# Patient Record
Sex: Female | Born: 1965
Health system: Southern US, Community
[De-identification: ages and names within clinical notes are randomized; demographics above are authoritative.]

## PROBLEM LIST (undated history)

## (undated) DIAGNOSIS — R9431 Abnormal electrocardiogram [ECG] [EKG]: Secondary | ICD-10-CM

## (undated) DIAGNOSIS — K760 Fatty (change of) liver, not elsewhere classified: Secondary | ICD-10-CM

## (undated) DIAGNOSIS — E7800 Pure hypercholesterolemia, unspecified: Secondary | ICD-10-CM

## (undated) DIAGNOSIS — D72819 Decreased white blood cell count, unspecified: Secondary | ICD-10-CM

## (undated) DIAGNOSIS — G4730 Sleep apnea, unspecified: Secondary | ICD-10-CM

## (undated) DIAGNOSIS — E785 Hyperlipidemia, unspecified: Secondary | ICD-10-CM

## (undated) DIAGNOSIS — I10 Essential (primary) hypertension: Secondary | ICD-10-CM

## (undated) DIAGNOSIS — R7301 Impaired fasting glucose: Secondary | ICD-10-CM

## (undated) DIAGNOSIS — N3281 Overactive bladder: Secondary | ICD-10-CM

## (undated) DIAGNOSIS — M545 Low back pain, unspecified: Secondary | ICD-10-CM

## (undated) DIAGNOSIS — G4733 Obstructive sleep apnea (adult) (pediatric): Secondary | ICD-10-CM

## (undated) DIAGNOSIS — D259 Leiomyoma of uterus, unspecified: Secondary | ICD-10-CM

## (undated) DIAGNOSIS — E559 Vitamin D deficiency, unspecified: Secondary | ICD-10-CM

## (undated) DIAGNOSIS — D649 Anemia, unspecified: Secondary | ICD-10-CM

## (undated) HISTORY — DX: Pure hypercholesterolemia, unspecified: E78.00

## (undated) HISTORY — DX: Sleep apnea, unspecified: G47.30

## (undated) HISTORY — DX: Fatty (change of) liver, not elsewhere classified: K76.0

## (undated) HISTORY — DX: Low back pain, unspecified: M54.50

## (undated) HISTORY — DX: Obstructive sleep apnea (adult) (pediatric): G47.33

## (undated) HISTORY — DX: Morbid (severe) obesity due to excess calories: E66.01

## (undated) HISTORY — DX: Decreased white blood cell count, unspecified: D72.819

## (undated) HISTORY — DX: Vitamin D deficiency, unspecified: E55.9

## (undated) HISTORY — DX: Hyperlipidemia, unspecified: E78.5

## (undated) HISTORY — DX: Leiomyoma of uterus, unspecified: D25.9

## (undated) HISTORY — DX: Abnormal electrocardiogram (ECG) (EKG): R94.31

## (undated) HISTORY — DX: Impaired fasting glucose: R73.01

## (undated) HISTORY — DX: Essential (primary) hypertension: I10

## (undated) HISTORY — DX: Anemia, unspecified: D64.9

## (undated) HISTORY — DX: Overactive bladder: N32.81

---

## 1999-10-28 ENCOUNTER — Other Ambulatory Visit: Admission: RE | Admit: 1999-10-28 | Discharge: 1999-10-28 | Payer: Self-pay | Admitting: Obstetrics and Gynecology

## 1999-12-23 ENCOUNTER — Encounter: Payer: Self-pay | Admitting: Obstetrics & Gynecology

## 1999-12-23 ENCOUNTER — Ambulatory Visit (HOSPITAL_COMMUNITY): Admission: RE | Admit: 1999-12-23 | Discharge: 1999-12-23 | Payer: Self-pay | Admitting: Obstetrics & Gynecology

## 2000-03-09 ENCOUNTER — Ambulatory Visit (HOSPITAL_COMMUNITY): Admission: RE | Admit: 2000-03-09 | Discharge: 2000-03-09 | Payer: Self-pay | Admitting: Obstetrics and Gynecology

## 2000-03-09 ENCOUNTER — Encounter: Payer: Self-pay | Admitting: Obstetrics and Gynecology

## 2000-04-30 ENCOUNTER — Inpatient Hospital Stay (HOSPITAL_COMMUNITY): Admission: AD | Admit: 2000-04-30 | Discharge: 2000-05-03 | Payer: Self-pay | Admitting: Obstetrics and Gynecology

## 2000-05-04 ENCOUNTER — Encounter: Admission: RE | Admit: 2000-05-04 | Discharge: 2000-06-03 | Payer: Self-pay | Admitting: Obstetrics and Gynecology

## 2000-07-04 ENCOUNTER — Encounter: Admission: RE | Admit: 2000-07-04 | Discharge: 2000-08-03 | Payer: Self-pay | Admitting: Obstetrics and Gynecology

## 2000-08-04 ENCOUNTER — Encounter: Admission: RE | Admit: 2000-08-04 | Discharge: 2000-09-03 | Payer: Self-pay | Admitting: Obstetrics and Gynecology

## 2000-10-04 ENCOUNTER — Encounter: Admission: RE | Admit: 2000-10-04 | Discharge: 2000-11-03 | Payer: Self-pay | Admitting: Obstetrics and Gynecology

## 2000-11-04 ENCOUNTER — Encounter: Admission: RE | Admit: 2000-11-04 | Discharge: 2000-12-04 | Payer: Self-pay | Admitting: Obstetrics and Gynecology

## 2002-03-13 ENCOUNTER — Other Ambulatory Visit: Admission: RE | Admit: 2002-03-13 | Discharge: 2002-03-13 | Payer: Self-pay | Admitting: *Deleted

## 2002-03-13 ENCOUNTER — Other Ambulatory Visit: Admission: RE | Admit: 2002-03-13 | Discharge: 2002-03-13 | Payer: Self-pay | Admitting: Obstetrics and Gynecology

## 2002-05-15 ENCOUNTER — Ambulatory Visit (HOSPITAL_COMMUNITY): Admission: RE | Admit: 2002-05-15 | Discharge: 2002-05-15 | Payer: Self-pay | Admitting: Obstetrics and Gynecology

## 2002-05-15 ENCOUNTER — Encounter: Payer: Self-pay | Admitting: Obstetrics and Gynecology

## 2002-05-20 ENCOUNTER — Ambulatory Visit (HOSPITAL_COMMUNITY): Admission: RE | Admit: 2002-05-20 | Discharge: 2002-05-20 | Payer: Self-pay | Admitting: Obstetrics and Gynecology

## 2002-05-20 ENCOUNTER — Encounter: Payer: Self-pay | Admitting: Obstetrics and Gynecology

## 2002-09-20 ENCOUNTER — Inpatient Hospital Stay (HOSPITAL_COMMUNITY): Admission: AD | Admit: 2002-09-20 | Discharge: 2002-09-20 | Payer: Self-pay | Admitting: Obstetrics and Gynecology

## 2002-09-29 ENCOUNTER — Inpatient Hospital Stay (HOSPITAL_COMMUNITY): Admission: AD | Admit: 2002-09-29 | Discharge: 2002-10-02 | Payer: Self-pay | Admitting: Obstetrics and Gynecology

## 2002-10-03 ENCOUNTER — Encounter: Admission: RE | Admit: 2002-10-03 | Discharge: 2002-11-02 | Payer: Self-pay | Admitting: Obstetrics and Gynecology

## 2002-10-06 ENCOUNTER — Inpatient Hospital Stay (HOSPITAL_COMMUNITY): Admission: AD | Admit: 2002-10-06 | Discharge: 2002-10-06 | Payer: Self-pay | Admitting: Obstetrics and Gynecology

## 2002-11-03 ENCOUNTER — Encounter: Admission: RE | Admit: 2002-11-03 | Discharge: 2002-12-03 | Payer: Self-pay | Admitting: Obstetrics and Gynecology

## 2003-01-03 ENCOUNTER — Encounter: Admission: RE | Admit: 2003-01-03 | Discharge: 2003-02-02 | Payer: Self-pay | Admitting: Obstetrics and Gynecology

## 2003-03-05 ENCOUNTER — Encounter: Admission: RE | Admit: 2003-03-05 | Discharge: 2003-04-04 | Payer: Self-pay | Admitting: Obstetrics and Gynecology

## 2003-04-05 ENCOUNTER — Encounter: Admission: RE | Admit: 2003-04-05 | Discharge: 2003-05-05 | Payer: Self-pay | Admitting: Obstetrics and Gynecology

## 2003-06-03 ENCOUNTER — Encounter: Admission: RE | Admit: 2003-06-03 | Discharge: 2003-07-03 | Payer: Self-pay | Admitting: Obstetrics and Gynecology

## 2003-08-03 ENCOUNTER — Encounter: Admission: RE | Admit: 2003-08-03 | Discharge: 2003-09-02 | Payer: Self-pay | Admitting: Obstetrics and Gynecology

## 2005-01-27 ENCOUNTER — Other Ambulatory Visit: Admission: RE | Admit: 2005-01-27 | Discharge: 2005-01-27 | Payer: Self-pay | Admitting: Family Medicine

## 2006-01-31 ENCOUNTER — Other Ambulatory Visit: Admission: RE | Admit: 2006-01-31 | Discharge: 2006-01-31 | Payer: Self-pay | Admitting: *Deleted

## 2006-02-16 ENCOUNTER — Ambulatory Visit: Payer: Self-pay | Admitting: Gastroenterology

## 2007-11-25 ENCOUNTER — Other Ambulatory Visit: Admission: RE | Admit: 2007-11-25 | Discharge: 2007-11-25 | Payer: Self-pay | Admitting: Family Medicine

## 2009-02-10 ENCOUNTER — Other Ambulatory Visit: Admission: RE | Admit: 2009-02-10 | Discharge: 2009-02-10 | Payer: Self-pay | Admitting: Family Medicine

## 2010-07-15 NOTE — Discharge Summary (Signed)
Ann Zamora, Ann Zamora                   ACCOUNT NO.:  [id number]   MEDICAL RECORD NO.:  [id number]                   PATIENT TYPE:  INP   LOCATION:  9114                                 F[facility]ILITY[facility]  [facility]   PHYSICIAN:  Janine Limbo, M.D.            DATE OF BIRTH:  [birth year]   DATE OF ADMISSION:  09/29/2002  DATE OF DISCHARGE:  10/02/2002                                 DISCHARGE SUMMARY   ADMITTING DIAGNOSES:  1. Intrauterine pregnancy at 39 weeks.  2. Previous cesarean section x3, with plan repeat.  3. Chronic hypertension with the patient on Aldomet in good control.  4. Obesity.   DISCHARGE DIAGNOSES:  1. Intrauterine pregnancy at 39 weeks.  2. Previous cesarean section x2, with plan repeat.  3. Chronic hypertension with the patient on Aldomet in good control.  4. Obesity.   PROCEDURE:  1. Repeat low transverse cesarean section.  2. Spinal anesthesia.   HOSPITAL COURSE:  Ann Zamora is a 45 year old gravida 4, para 3-0-0-3 at 40  weeks who was admitted on September 29, 2002 for repeat scheduled cesarean  section.  Her pregnancy had been remarkable for previous cesarean sections  x3 with desire for repeat, advanced maternal age with normal amniocentesis,  PIH with her first pregnancy and then subsequent diagnosis of chronic  hypertension, obesity, history of positive group B Strep with her previous  pregnancy.  The patient was taken to the operating room where a repeat low  transverse cesarean section was performed by Hal Morales, M.D. as  attending physician.  Spinal anesthesia was utilized.  Findings were a  viable female by the name of Braden weight 7 pounds 15 ounces, Apgars 9 and 9.  Estimated blood loss was 700 mL.  The patient was taken to the recovery room  in good condition.  Infant was taken to the full-term nursery in good  condition.  By postoperative day one patient was deemed well.  She was up at  lib.  She was breast-feeding.  Her partner was  planning a vasectomy the  following week.  Her incision was clean, dry, and intact.  Her hemoglobin  was 9.8 down from 12.1.  White blood cell count was 8.8 and platelets were  201,000.  The rest of her hospital stay was uncomplicated.  By postoperative  day three she was doing well.  She was up at lib.  She had been on Aldomet  250 mg every day with blood pressures very stable.  Highest blood pressure  noted was 148/96.  Most were in the 120s/70 range.  Her incision was clean,  dry, and intact with subcuticular sutures in place.  Her fundus was firm.  Lochia was scant.  Her extremities were within normal limits.  She was  deemed to have received the full benefit of her hospital stay and was  discharged home.   DISCHARGE INSTRUCTIONS:  Per [facility] handout.   DISCHARGE MEDICATIONS:  1. Motrin  600 mg p.o. q.6h. p.r.n. pain.  2. Tylox one to two p.o. q.3-4h. p.r.n. pain.  3. Aldomet 250 mg one p.o. daily.   DISCHARGE FOLLOWUP:  Two weeks at [facility] for a blood pressure  recheck and Aldomet reevaluation.  The patient will also follow up with her  primary physician for further blood pressure evaluation after her  postpartum.  The patient will also make a six week postpartum visit at  [facility].     Renaldo Reel Emilee Hero, C.N.M.                   Janine Limbo, M.D.    Leeanne Mannan  D:  10/02/2002  T:  10/02/2002  Job:  [id number]

## 2010-07-15 NOTE — Op Note (Signed)
Ann Zamora, Ann Zamora                   ACCOUNT NO.:  [id number]   MEDICAL RECORD NO.:  [id number]                   PATIENT TYPE:  INP   LOCATION:  9114                                 [facility]   PHYSICIAN:  Hal Morales, M.D.             DATE OF BIRTH:  Feb 19, 1966   DATE OF PROCEDURE:  09/29/2002  DATE OF DISCHARGE:                                 OPERATIVE REPORT   PREOPERATIVE DIAGNOSES:  1. Intrauterine pregnancy at term.  2. Prior cesarean section x3.  3. Desire for repeat cesarean section.  4. Chronic hypertension.   POSTOPERATIVE DIAGNOSES:  1. Intrauterine pregnancy at term.  2. Prior cesarean section x3.  3. Desire for repeat cesarean section.  4. Chronic hypertension.   OPERATION:  Repeat low transverse cesarean section.   SURGEON:  Hal Morales, M.D.   FIRST ASSISTANT:  Renaldo Reel. Emilee Hero, C.N.M.   ANESTHESIA:  Spinal.   ESTIMATED BLOOD LOSS:  750 mL.   COMPLICATIONS:  None.   FINDINGS:  The patient was delivered of a female infant whose name is Braden,  weighing 7 pounds 15 ounces, with Apgars of 9 and 9 at one and five minutes,  respectively.  The uterus was upper limits of normal size for the immediate  postpartum period.  The placenta contained an eccentrically-inserted three-  vessel cord.   DESCRIPTION OF PROCEDURE:  The patient was taken to the operating room after  appropriate identification and placed on the operating table.  After  placement of a spinal anesthetic, she was placed in the supine position with  a left lateral tilt.  The abdomen and perineum were prepped with multiple  layers of Betadine and a Foley catheter inserted into the bladder and  connected to straight drainage.  The abdomen was draped as a sterile field.  The site of the previous cesarean section incision was infiltrated with 10  mL of 0.25% Marcaine.  A transverse incision was made in the abdomen and the  abdomen opened in layers.  The peritoneum was  entered and the bladder blade  placed.  The uterus was incised approximately 2 cm above the uterovesical  fold and the infant delivered from the occiput transverse position with the  aid of a Kiwi vacuum extractor.  The nares and pharynx were suctioned and  the cord clamped and cut.  The infant was handed off to the awaiting  pediatricians.  The appropriate cord blood was drawn and the placenta  allowed to separate from the uterus and then was removed from the operative  field.  The uterine incision was closed with a running interlocking suture  of 0 Vicryl.  An imbricating suture of 0 Vicryl was placed.  Hemostasis was  achieved with several figure-of-eight sutures of 0 Vicryl in the left  uterine angle.  Copious irrigation was carried out and hemostasis noted to  be adequate.  The abdominal peritoneum was closed with a running suture of 2-  0 Vicryl.  The rectus muscle was reapproximated in the midline with a figure-  of-eight suture of 2-0 Vicryl.  The rectus fascia was closed with a running  suture of 0 Vicryl and reinforced on either side of midline with figure-of-  eight sutures of 0 Vicryl.  The subcutaneous tissue was irrigated and made  hemostatic with Bovie cautery.  A subcuticular suture of 3-0 Monocryl was  used to close the skin incision.  A sterile dressing was applied.  The  patient was taken from the operating room to the recovery room in  satisfactory condition, having tolerated the procedure well with sponge and  instrument counts correct.  The infant went to the full-term nursery.                                               Hal Morales, M.D.    VPH/MEDQ  D:  09/29/2002  T:  09/29/2002  Job:  [id number]

## 2010-07-15 NOTE — Op Note (Signed)
[facility]  Patient:    Ann Zamora, Ann Zamora                MRN: [id number] Proc. Date: 04/30/00 Attending:  Erie Noe P. Pennie Rushing, M.D.                           Operative Report  PREOPERATIVE DIAGNOSES:         1. Intrauterine pregnancy at term.                                 2. Prior cesarean section, desire for repeat                                    cesarean section.  POSTOPERATIVE DIAGNOSES:        1. Intrauterine pregnancy at term.                                 2. Prior cesarean section, desire for repeat                                    cesarean section  OPERATION:                      Repeat low transverse cesarean section.  SURGEON:                        Vanessa P. Pennie Rushing, M.D.  FIRST ASSISTANT:                Nigel Bridgeman, C.N.M.  ANESTHESIA:                     Spinal.  ESTIMATED BLOOD LOSS:           1000 cc.  COMPLICATIONS:                  Required use of two different vacuum extractors to deliver the fetal head.  FINDINGS:                       The uterus, tubes and ovaries were normal for the gravid state.  The patient was delivered of a female infant whos name is Apolinar Junes weighing 7 pounds 10 ounces with Apgars of 6 and 9 at one and five minutes, respectively.  FINDINGS:                       The patient was delivered of a infant whose name is     weighing  pounds  ounces with Apgars of  and   at one and five minutes, respectively.  The uterus, tubes and ovaries were normal for the gravid state.  DESCRIPTION OF PROCEDURE:       The patient was taken to the operating room after appropriate identification and placed on the operating room table. After placement of a spinal anesthetic, she was placed in the supine position with a left lateral tilt.  The abdomen and perineum were prepped with multiple layers of Betadine and a Foley catheter inserted into the bladder and connected to straight drainage.  The abdomen was  draped as a sterile field. After the assurance of adequate anesthesia, a transverse incision was made in the abdomen, and the abdomen opened in layers.  The peritoneum, and the uterus noted to have minimal development of the lower uterine segment.  Evaluation determined that the fetus was vertex but not in the pelvis.  A decision was made to make a low transverse incision approximately 1 cm above the uterovesical fold.  That was carried down to the level of the amniotic sac and bandage scissors were used to enlarge that incision prior to rupture of the amniotic membranes.  The amniotic membranes were then ruptured and a hand presentation noted.  The hand was reduced back into the uterus and a Kiwi vacuum extractor used to bring the vertex into the pelvis along with expulsive efforts from the surgical assistant.  The Kiwi vacuum extractor popped off approximately four times requiring replacement in an effort to adequately position the vertex for delivery.  A MityVac vacuum extractor was then used to further position the vertex, and the Kiwi vacuum extractor again applied which allowed delivery of the fetal head.  The nares and pharynx were suctioned and the remainder of the infant delivered without difficulty.  The cord was clamped and cut and the infant handed off to the awaiting pediatricians.  The appropriate cord blood was drawn and the placenta manually removed from the uterus.  The uterine cavity was then closed with a running interlocking suture of 0 Vicryl.  An imbricating suture of 0 Vicryl was then placed.  Hemostasis was achieved with a single figure-of-eight suture of 0 Vicryl.  Copious irrigation was carried out and the abdominal peritoneum closed with a running suture of 2-0 Vicryl.  The rectus muscles were reapproximated in the midline with a figure-of-eight suture of 2-0 Vicryl.  The rectus muscles were irrigated and noted to be hemostatic, and the rectus fascia closed with  a running suture of 0 Vicryl and then reinforced on either side of midline with figure-of-eight sutures of 0 Vicryl.  The subcutaneous tissue was made hemostatic with Bovie cautery and irrigated and skin staples applied to the skin incision.  A sterile dressing was applied, and the patient was taken from the operating room to the recovery room in satisfactory condition having tolerated the procedure well with sponge and instrument counts correct. DD:  04/30/00 TD:  04/30/00 Job: [id number]

## 2010-07-15 NOTE — Op Note (Signed)
   NAMERUTHER, Ann Zamora                   ACCOUNT NO.:  [id number]   MEDICAL RECORD NO.:  [id number]                   PATIENT TYPE:  OUT   LOCATION:  ULT                                  [facility]  [facility]   PHYSICIAN:  Hal Morales, M.D.             DATE OF BIRTH:  November 09, 1965   DATE OF PROCEDURE:  05/20/2002  DATE OF DISCHARGE:  05/20/2002                                 OPERATIVE REPORT   PREOPERATIVE DIAGNOSES:  1. Intrauterine pregnancy at [redacted] weeks gestation.  2. Maternal age 45.  3. Increased risk of Down syndrome secondary to echogenic intracardiac foci.   POSTOPERATIVE DIAGNOSES:  1. Intrauterine pregnancy at [redacted] weeks gestation.  2. Maternal age 50.  3. Increased risk of Down syndrome secondary to echogenic intracardiac foci.   OPERATION:  Genetic amniocentesis.   SURGEON:  Hal Morales, M.D.   ANESTHESIA:  Local.   ESTIMATED BLOOD LOSS:  Less than 5 mL.   COMPLICATIONS:  None.   FINDINGS:  The fetus was consistent with a 19-week fetus with a posterior  placenta and normal amniotic fluid.   PROCEDURE:  The patient had had an anatomy ultrasound on May 15, 2002  during which time two echogenic intracardiac foci were identified within the  left cardiac ventricle.  This finding was thought to raise the risk of Down  syndrome to 1 in 120.  This was discussed with the patient and the risk of  spontaneous abortion of approximately 1 in 250 reiterated.  The patient  wished to proceed.  Ultrasound evaluation noting the normal amniotic fluid  and posterior placenta was undertaken.  An area just below the umbilicus was  identified that contained amniotic fluid and fetal lower extremities, but no  cord.  This area was prepped and draped and infiltration with 3 mL of 1%  Xylocaine was undertaken.  Using a 22-gauge Ultravue needle the amniotic  fluid sac was accessed on the second stick.  The first syringe contained 5  mL of initially blood tinged amniotic fluid  which subsequently cleared.  The  second syringe contained 15 mL of clear amniotic fluid.  Documentation of  the amniocentesis site was undertaken and the needle removed.  The post  amniocentesis heart rate was within the normal limit in the 140 range.  The  fluid was sent to [facility] for evaluation.  The patient's  blood type is A+.  She tolerated procedure well.  She was given printed  instructions for post amniocentesis care.                                               Hal Morales, M.D.    VPH/MEDQ  D:  05/21/2002  T:  05/22/2002  Job:  [id number]

## 2010-07-15 NOTE — H&P (Signed)
Ann Zamora, Ann Zamora                          ACCOUNT NO.:  [id number]   MEDICAL RECORD NO.:  [id number]                   PATIENT TYPE:   LOCATION:                                       FACILITY:   PHYSICIAN:  Hal Morales, M.D.             DATE OF BIRTH:   DATE OF ADMISSION:  09/29/2002  DATE OF DISCHARGE:                                HISTORY & PHYSICAL   HISTORY OF PRESENT ILLNESS:  Ann Zamora is a 45 year old, G4, P3-0-0-3 at 4  weeks who presents today for scheduled repeat cesarean section.  Her  pregnancy has been remarkable for previous C-section x3 with desire for  repeat, advanced maternal age with normal amniocentesis, PIH with her first  pregnancy and then subsequently diagnosed with chronic hypertension, obesity  and history of positive Group B Streptococcus with her previous pregnancy.   PRENATAL LABORATORY DATA:  Blood type A positive.  Rh antibody negative.  VDRL nonreactive.  Rubella titer positive.  Hepatitis B surface antigen  negative.  HIV nonreactive.  Cystic fibrosis testing was negative.  Pap  smear showed yeast.  GC and Chlamydia cultures were negative.  AP was  normal.  The patient did have an amniocentesis secondary to two echogenic  intracardiac foci noted on ultrasound.  This was a normal amniocentesis.  Hemoglobin upon entry into the practice was 11.9.  It was within normal  limits at 28 weeks.  Group B Streptococcus culture was negative at 36 weeks.  EDC of October 06, 2002, was established by last menstrual period and was in  agreement with ultrasound at approximately 18 weeks.   PRENATAL COURSE:  The patient entered care at approximately 10 weeks.  She  originally had declined amniocentesis, but then had ultrasound at 18 weeks  that showed two localized areas of echogenic intracardiac foci which made  her risk of Trisomy 21 1 in 120.  The patient elected to proceed with  amniocentesis.  Findings were normal.  She did have some  second-trimester  spotting at about 16 weeks.  This was evaluated with cultures and was  normal.  She was also treated for UTI at that time with Septra.  She was  treated for BV at 22 weeks.  She did travel to [place] in her second-  trimester.  Her amniocentesis showed a normal female.  The rest of her  pregnancy was essentially uncomplicated.  She was on Aldomet during her  pregnancy with good control of her blood pressure.   PAST OBSTETRICAL HISTORY:  In 1991, she had a primary low transverse  cesarean section for a female infant, weight 7 pounds 9 ounces at 38 weeks.  In 1998, she had a repeat section at 36 weeks for a female infant, weight 5  pounds 9 ounces.  Third pregnancy in 2002, had a repeat C-section for a  viable female, weight 7 pounds 10 ounces at 38 weeks.  She did  have some  chronic hypertension during that pregnancy.   PAST MEDICAL HISTORY:  1. She was diagnosed with chronic hypertension in 1998.  2. She does have some superficial varicosities.   PAST SURGICAL HISTORY:  Three C-sections.   ALLERGIES:  No known drug allergies.   FAMILY HISTORY:  Her mother and father have chronic hypertension.  Her  maternal grandmother has superficial varicosities.  Her mother has adult-  onset diabetes and thyroid disease.   GENETIC HISTORY:  Remarkable for the patient's advanced maternal age of 72,  but normal amniocentesis was noted.   SOCIAL HISTORY:  The patient is married to the father of the baby.  He is  involved and supportive.  His name is Maycel Riffe.  The patient is  African-American of the [place] faith.  She has been followed by the  physician services of [facility].  She denies any alcohol, drug  or tobacco use during this pregnancy.   PHYSICAL EXAMINATION:  VITAL SIGNS:  Stable.  HEENT:  Within normal limits.  LUNGS:  Bilateral breath sounds are clear.  CARDIAC:  Regular rate and rhythm without murmur.  BREASTS:  Soft and nontender.  ABDOMEN:   Fundal height is approximately 40 cm.  Estimated fetal weight 7-8  pounds.  Uterine contractions are none per patient report.  Fetal heart rate  is in the 140s-150s by Doppler.  PELVIC:  Deferred.  EXTREMITIES:  Deep tendon reflexes 2+ without clonus.  There is a trace to  1+ edema noted in the lower extremities.   IMPRESSION:  1. Intrauterine pregnancy at 39 weeks.  2. Previous cesarean section x3 with planned repeat.  3. Chronic hypertension with the patient on Aldomet in good control.  4. Obesity.   PLAN:  1. Admit to the [facility] per consult with Dr. Pennie Rushing     as attending physician for repeat cesarean section.  2. Routine preop orders per Dr. Pennie Rushing.     Renaldo Reel Emilee Hero, C.N.M.                   Hal Morales, M.D.    Leeanne Mannan  D:  09/29/2002  T:  09/29/2002  Job:  [id number]

## 2010-07-15 NOTE — Discharge Summary (Signed)
[facility]  Patient:    Ann Zamora, Ann Zamora                MRN: [id number] Adm. Date:  [id number] Disc. Date: 05/03/00 Attending:  Shaune Spittle Dictator:   Vance Gather Duplantis, C.N.M.                           Discharge Summary  ADMITTING DIAGNOSES:          1. Intrauterine pregnancy at term.                               2. Previous cesarean section.                               3. History of chronic hypertension.                               4. Desires repeat cesarean section.  DISCHARGE DIAGNOSES:          1. Intrauterine pregnancy at term.                               2. Previous cesarean section.                               3. History of chronic hypertension.                               4. Desires repeat cesarean section.                               5. Breast feeding and desires Micronor for                                  contraception.  PROCEDURES:                   A repeat low transverse cesarean section for delivery of a viable female infant named Apolinar Junes, who weighed 7 pounds 10 ounces, and had Apgars of 6 and 9 on April 30, 2000, by Dr. Dierdre Forth.  HOSPITAL COURSE:              Ms. Rogacki is a 45 year old African-American female, gravida 3, para 2-0-0-2, at [redacted] weeks gestation, who was admitted for elective repeat cesarean section and underwent the same for a viable female infant named Apolinar Junes, who weighed 7 pounds 10 ounces, and Apgars of 6 and 9 on April 30, 2000, by Dr. Dierdre Forth without complications. Postoperatively she has done well.  She is ambulating, voiding, and eating without difficulty.  She is tolerating a regular diet without difficulty.  Her vital signs are stable, and she is afebrile.  Her husband plans to have a vasectomy, but she desires Micronor in the interim.  She is breast-feeding without difficulty.  She was deemed ready for discharge today.  DISCHARGE INSTRUCTIONS:       As  per the [facility] handout.  DISCHARGE MEDICATIONS:  1. Motrin 600 mg p.o. q.6h. p.r.n. for pain.                               2. Tylox 1-2 p.o. q.4-6h. p.r.n. for pain.                               3. Vicodin 1-2 p.o. q.4-6h. p.r.n. for pain.                               4. Micronor 1 p.o. q.d. to start on May 20, 2000.  DISCHARGE LABORATORY:         Her WBC count is 7.5.  Her hemoglobin is 9.2, and her platelets are 173.  DISCHARGE FOLLOW-UP:          In six weeks at [facility] or p.r.n. DD:  05/03/00 TD:  05/03/00 Job: [id number]
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
## 2014-05-29 ENCOUNTER — Telehealth: Payer: Self-pay | Admitting: Internal Medicine

## 2014-05-29 NOTE — Telephone Encounter (Signed)
Spoke with patient and scheduled appt to see Dr. Curt Bears 07/08/14 11 am

## 2014-06-17 ENCOUNTER — Telehealth: Payer: Self-pay | Admitting: Internal Medicine

## 2014-06-17 NOTE — Telephone Encounter (Signed)
Dx-persistent leukopenia Referring Dr. Carol Ada

## 2014-07-08 ENCOUNTER — Encounter (INDEPENDENT_AMBULATORY_CARE_PROVIDER_SITE_OTHER): Payer: Self-pay

## 2014-07-08 ENCOUNTER — Ambulatory Visit: Payer: 59

## 2014-07-08 ENCOUNTER — Encounter: Payer: Self-pay | Admitting: Internal Medicine

## 2014-07-08 ENCOUNTER — Other Ambulatory Visit (HOSPITAL_BASED_OUTPATIENT_CLINIC_OR_DEPARTMENT_OTHER): Payer: 59

## 2014-07-08 ENCOUNTER — Ambulatory Visit (HOSPITAL_BASED_OUTPATIENT_CLINIC_OR_DEPARTMENT_OTHER): Payer: 59 | Admitting: Internal Medicine

## 2014-07-08 ENCOUNTER — Telehealth: Payer: Self-pay | Admitting: Internal Medicine

## 2014-07-08 ENCOUNTER — Other Ambulatory Visit: Payer: Self-pay | Admitting: Internal Medicine

## 2014-07-08 VITALS — BP 152/86 | HR 61 | Temp 98.4°F | Resp 18 | Ht 65.0 in | Wt 190.6 lb

## 2014-07-08 DIAGNOSIS — D509 Iron deficiency anemia, unspecified: Secondary | ICD-10-CM | POA: Insufficient documentation

## 2014-07-08 DIAGNOSIS — D72819 Decreased white blood cell count, unspecified: Secondary | ICD-10-CM

## 2014-07-08 DIAGNOSIS — E559 Vitamin D deficiency, unspecified: Secondary | ICD-10-CM

## 2014-07-08 DIAGNOSIS — R5382 Chronic fatigue, unspecified: Secondary | ICD-10-CM

## 2014-07-08 DIAGNOSIS — M545 Low back pain, unspecified: Secondary | ICD-10-CM | POA: Insufficient documentation

## 2014-07-08 DIAGNOSIS — G4730 Sleep apnea, unspecified: Secondary | ICD-10-CM

## 2014-07-08 DIAGNOSIS — M5440 Lumbago with sciatica, unspecified side: Secondary | ICD-10-CM

## 2014-07-08 DIAGNOSIS — I10 Essential (primary) hypertension: Secondary | ICD-10-CM | POA: Insufficient documentation

## 2014-07-08 DIAGNOSIS — D259 Leiomyoma of uterus, unspecified: Secondary | ICD-10-CM

## 2014-07-08 DIAGNOSIS — R5383 Other fatigue: Secondary | ICD-10-CM | POA: Insufficient documentation

## 2014-07-08 LAB — IRON AND TIBC CHCC
%SAT: 64 % — ABNORMAL HIGH (ref 21–57)
Iron: 215 ug/dL — ABNORMAL HIGH (ref 41–142)
TIBC: 335 ug/dL (ref 236–444)
UIBC: 120 ug/dL (ref 120–384)

## 2014-07-08 LAB — CBC WITH DIFFERENTIAL/PLATELET
BASO%: 0.8 % (ref 0.0–2.0)
Basophils Absolute: 0 10*3/uL (ref 0.0–0.1)
EOS%: 2.1 % (ref 0.0–7.0)
Eosinophils Absolute: 0.1 10*3/uL (ref 0.0–0.5)
HCT: 42.8 % (ref 34.8–46.6)
HGB: 13.4 g/dL (ref 11.6–15.9)
LYMPH%: 40.6 % (ref 14.0–49.7)
MCH: 26 pg (ref 25.1–34.0)
MCHC: 31.3 g/dL — ABNORMAL LOW (ref 31.5–36.0)
MCV: 83 fL (ref 79.5–101.0)
MONO#: 0.4 10*3/uL (ref 0.1–0.9)
MONO%: 10.3 % (ref 0.0–14.0)
NEUT#: 1.6 10*3/uL (ref 1.5–6.5)
NEUT%: 46.2 % (ref 38.4–76.8)
Platelets: 181 10*3/uL (ref 145–400)
RBC: 5.16 10*6/uL (ref 3.70–5.45)
RDW: 17 % — ABNORMAL HIGH (ref 11.2–14.5)
WBC: 3.6 10*3/uL — ABNORMAL LOW (ref 3.9–10.3)
lymph#: 1.4 10*3/uL (ref 0.9–3.3)

## 2014-07-08 LAB — COMPREHENSIVE METABOLIC PANEL (CC13)
ALK PHOS: 47 U/L (ref 40–150)
ALT: 25 U/L (ref 0–55)
AST: 58 U/L — AB (ref 5–34)
Albumin: 4.2 g/dL (ref 3.5–5.0)
Anion Gap: 9 mEq/L (ref 3–11)
BILIRUBIN TOTAL: 0.61 mg/dL (ref 0.20–1.20)
BUN: 12 mg/dL (ref 7.0–26.0)
CO2: 27 mEq/L (ref 22–29)
CREATININE: 0.9 mg/dL (ref 0.6–1.1)
Calcium: 9.1 mg/dL (ref 8.4–10.4)
Chloride: 104 mEq/L (ref 98–109)
EGFR: 89 mL/min/{1.73_m2} — ABNORMAL LOW (ref >90)
Glucose: 77 mg/dl (ref 70–140)
Potassium: 3.9 mEq/L (ref 3.5–5.1)
SODIUM: 139 meq/L (ref 136–145)
Total Protein: 7.8 g/dL (ref 6.4–8.3)

## 2014-07-08 LAB — LACTATE DEHYDROGENASE (CC13): LDH: 247 U/L — ABNORMAL HIGH (ref 125–245)

## 2014-07-08 LAB — VITAMIN B12: VITAMIN B 12: 752 pg/mL (ref 211–911)

## 2014-07-08 LAB — FOLATE

## 2014-07-08 LAB — FERRITIN CHCC: FERRITIN: 14 ng/mL (ref 9–269)

## 2014-07-08 NOTE — Progress Notes (Signed)
Checked in new pt with no financial concerns. °

## 2014-07-08 NOTE — Progress Notes (Signed)
[place] [phone]   [phone]  CONSULT NOTE  REFERRING PHYSICIAN: Dr. Carol Ada  REASON FOR CONSULTATION:  49 years old African-American female with leukocytopenia.  HPI Ann Zamora is a 49 y.o. female was past medical history significant for hypertension, obstructive sleep apnea, fibroid uterus, low back pain, iron deficiency anemia as well as vitamin D deficiency. The patient was seen recently by her primary care physician Dr. Tamala Julian for routine annual follow-up visit and CBC was performed on 03/11/2014 and it showed low white blood count of 3.1 with absolute neutrophil count of 1700. The patient had low hemoglobin and hematocrit but normal platelets count. Repeat CBC twice on 04/15/2014 and 05/15/2014 showed persistent low white blood count of 2.8 with absolute neutrophil count of 1000. There was improvement in her hemoglobin and hematocrit after the patient was started on treatment with Fusion plus. The patient was referred to me today for further evaluation and recommendation regarding her condition. She denied having any recent viral infection. The patient denied having any rheumatological disorder. She denied having any history of hepatitis or HIV. She take some NSAIDs as needed for aching pain including Voltaren and Aleve but no more than one or 2 times a week. The patient denied having any bleeding issues, bruises or ecchymosis. She has no significant weight loss or night sweats. She has no chest pain, shortness of breath, cough or hemoptysis. Family history is remarkable for a mother and father with hypertension and diabetes mellitus. The patient is married and has 4 children. She does domestic work for residential facilities. She has a remote history of smoking but not recently. The patient also drinks alcohol occasionally and no history of drug abuse. HPI  Past Medical History  Diagnosis Date  . Anemia   . Hypertension   . Sleep  apnea   . Fibroid uterus     History reviewed. No pertinent past surgical history.  Family History  Problem Relation Age of Onset  . Diabetes Mother   . Hypertension Mother   . Diabetes Father   . Hypertension Father     Social History History  Substance Use Topics  . Smoking status: Never Smoker   . Smokeless tobacco: Never Used  . Alcohol Use: No    Not on File  Current Outpatient Prescriptions  Medication Sig Dispense Refill  . amLODipine (NORVASC) 10 MG tablet Take 10 mg by mouth daily.    . Iron-FA-B Cmp-C-Biot-Probiotic (FUSION PLUS PO) Take 1 each by mouth.    . Multiple Vitamins-Minerals (MULTIVITAMIN ADULT PO) Take 1 each by mouth.     No current facility-administered medications for this visit.    Review of Systems  Constitutional: negative Eyes: negative Ears, nose, mouth, throat, and face: negative Respiratory: negative Cardiovascular: negative Gastrointestinal: negative Genitourinary:negative Integument/breast: negative Hematologic/lymphatic: negative Musculoskeletal:negative Neurological: negative Behavioral/Psych: negative Endocrine: negative Allergic/Immunologic: negative  Physical Exam  [id number], healthy, no distress, well nourished and well developed SKIN: skin color, texture, turgor are normal, no rashes or significant lesions HEAD: Normocephalic, No masses, lesions, tenderness or abnormalities EYES: normal, PERRLA EARS: External ears normal, Canals clear OROPHARYNX:no exudate, no erythema and lips, buccal mucosa, and tongue normal  NECK: supple, no adenopathy, no JVD LYMPH:  no palpable lymphadenopathy, no hepatosplenomegaly BREAST:not examined LUNGS: clear to auscultation , and palpation HEART: regular rate & rhythm and no murmurs ABDOMEN:abdomen soft, non-tender, normal bowel sounds and no masses or organomegaly BACK: Back symmetric, no curvature., No CVA tenderness EXTREMITIES:no joint  deformities, effusion, or inflammation, no  edema, no skin discoloration  NEURO: alert & oriented x 3 with fluent speech, no focal motor/sensory deficits  PERFORMANCE STATUS: ECOG 0  LABORATORY DATA: Lab Results  Component Value Date   WBC 3.6* 07/08/2014   HGB 13.4 07/08/2014   HCT 42.8 07/08/2014   MCV 83.0 07/08/2014   PLT 181 07/08/2014      Chemistry      Component Value Date/Time   NA 139 07/08/2014 1126   K 3.9 07/08/2014 1126   CO2 27 07/08/2014 1126   BUN 12.0 07/08/2014 1126   CREATININE 0.9 07/08/2014 1126      Component Value Date/Time   CALCIUM 9.1 07/08/2014 1126   ALKPHOS 47 07/08/2014 1126   AST 58* 07/08/2014 1126   ALT 25 07/08/2014 1126   BILITOT 0.61 07/08/2014 1126       RADIOGRAPHIC STUDIES: No results found.  ASSESSMENT: This is a very pleasant 49 years old African-American female presented for evaluation of leukocytopenia. Her CBC today showed mild leukocytopenia which could be drug induced or ethnic in origin but I cannot rule out any other abnormality at this point.   PLAN: I had a lengthy discussion with the patient today about her condition. I ordered several studies for evaluation of her leukocytopenia including repeat CBC, comprehensive metabolic panel, LDH, iron study, ferritin, serum folate, serum vitamin B 12 as well as rheumatoid factor and ANA. I will also consider testing the patient for HIV and hepatitis panel. I will see her back for follow-up visit in 3 months for reevaluation and repeat CBC and LDH. If her leukocytopenia is getting worse, I may consider the patient for a bone marrow biopsy and aspirate to rule out any underlying bone marrow abnormality. For the history of iron deficiency anemia, the patient will continue on Fusion plus as prescribed by her primary care physician. The patient voices understanding of current disease status and treatment options and is in agreement with the current care plan. The patient was advised to call immediately if she has any concerning  symptoms in the interval. All questions were answered. The patient knows to call the clinic with any problems, questions or concerns. We can certainly see the patient much sooner if necessary.  Thank you so much for allowing me to participate in the care of Ann Zamora. I will continue to follow up the patient with you and assist in her care.  I spent 40 minutes counseling the patient face to face. The total time spent in the appointment was 60 minutes.  Disclaimer: This note was dictated with voice recognition software. Similar sounding words can inadvertently be transcribed and may not be corrected upon review.   Tuyen Uncapher K. Jul 08, 2014, 4:02 PM

## 2014-07-08 NOTE — Telephone Encounter (Signed)
Pt confirmed labs/ov per 05/11 POF, gave pt AVS and Calendar.... KJ °

## 2014-07-09 LAB — RHEUMATOID FACTOR

## 2014-07-09 LAB — ANA: Anti Nuclear Antibody(ANA): NEGATIVE

## 2014-10-06 ENCOUNTER — Other Ambulatory Visit: Payer: Self-pay | Admitting: Family Medicine

## 2014-10-06 DIAGNOSIS — R748 Abnormal levels of other serum enzymes: Secondary | ICD-10-CM

## 2014-10-07 ENCOUNTER — Ambulatory Visit (HOSPITAL_BASED_OUTPATIENT_CLINIC_OR_DEPARTMENT_OTHER): Payer: 59 | Admitting: Internal Medicine

## 2014-10-07 ENCOUNTER — Encounter: Payer: Self-pay | Admitting: Internal Medicine

## 2014-10-07 ENCOUNTER — Other Ambulatory Visit (HOSPITAL_BASED_OUTPATIENT_CLINIC_OR_DEPARTMENT_OTHER): Payer: 59

## 2014-10-07 ENCOUNTER — Telehealth: Payer: Self-pay | Admitting: Internal Medicine

## 2014-10-07 VITALS — BP 138/80 | HR 59 | Temp 99.1°F | Resp 17 | Ht 65.0 in | Wt 189.0 lb

## 2014-10-07 DIAGNOSIS — D509 Iron deficiency anemia, unspecified: Secondary | ICD-10-CM | POA: Diagnosis not present

## 2014-10-07 DIAGNOSIS — D72819 Decreased white blood cell count, unspecified: Secondary | ICD-10-CM | POA: Diagnosis not present

## 2014-10-07 DIAGNOSIS — D709 Neutropenia, unspecified: Secondary | ICD-10-CM

## 2014-10-07 LAB — CBC WITH DIFFERENTIAL/PLATELET
BASO%: 1.4 % (ref 0.0–2.0)
BASOS ABS: 0 10*3/uL (ref 0.0–0.1)
EOS ABS: 0.1 10*3/uL (ref 0.0–0.5)
EOS%: 4 % (ref 0.0–7.0)
HCT: 35.7 % (ref 34.8–46.6)
HGB: 11.2 g/dL — ABNORMAL LOW (ref 11.6–15.9)
LYMPH%: 50.1 % — ABNORMAL HIGH (ref 14.0–49.7)
MCH: 25.9 pg (ref 25.1–34.0)
MCHC: 31.3 g/dL — ABNORMAL LOW (ref 31.5–36.0)
MCV: 82.6 fL (ref 79.5–101.0)
MONO#: 0.3 10*3/uL (ref 0.1–0.9)
MONO%: 12 % (ref 0.0–14.0)
NEUT%: 32.5 % — ABNORMAL LOW (ref 38.4–76.8)
NEUTROS ABS: 0.9 10*3/uL — AB (ref 1.5–6.5)
PLATELETS: 209 10*3/uL (ref 145–400)
RBC: 4.33 10*6/uL (ref 3.70–5.45)
RDW: 14.5 % (ref 11.2–14.5)
WBC: 2.8 10*3/uL — ABNORMAL LOW (ref 3.9–10.3)
lymph#: 1.4 10*3/uL (ref 0.9–3.3)

## 2014-10-07 LAB — LACTATE DEHYDROGENASE (CC13): LDH: 228 U/L (ref 125–245)

## 2014-10-07 NOTE — Progress Notes (Signed)
[place] [phone]   [phone]  OFFICE PROGRESS NOTE  No PCP Per Patient No address on file  DIAGNOSIS: Leukocytopenia of unknown etiology  PRIOR THERAPY: None  CURRENT THERAPY: None  INTERVAL HISTORY: Ann Zamora 49 y.o. female returns to the clinic today for follow-up visit. The patient was seen a few weeks ago for evaluation of leukocytopenia and she had several studies performed including ANA, rheumatoid factor, vitamin B-12 level, serum folate, iron study and ferritin that were unremarkable. She was monitored by observation and she came today for reevaluation with repeat CBC. The patient is feeling fine with no specific complaints today. She denied having any significant fever or chills. She has no weight loss or night sweats. She has no bleeding issues. She denied having any chest pain, shortness of breath, cough or hemoptysis.  MEDICAL HISTORY: Past Medical History  Diagnosis Date  . Anemia   . Hypertension   . Sleep apnea   . Fibroid uterus     ALLERGIES:  has no allergies on file.  MEDICATIONS:  Current Outpatient Prescriptions  Medication Sig Dispense Refill  . amLODipine (NORVASC) 10 MG tablet Take 10 mg by mouth daily.    . Iron-FA-B Cmp-C-Biot-Probiotic (FUSION PLUS PO) Take 1 each by mouth.    . Multiple Vitamins-Minerals (MULTIVITAMIN ADULT PO) Take 1 each by mouth.     No current facility-administered medications for this visit.    SURGICAL HISTORY: No past surgical history on file.  REVIEW OF SYSTEMS:  A comprehensive review of systems was negative.   PHYSICAL EXAMINATION: General appearance: alert, cooperative and no distress Head: Normocephalic, without obvious abnormality, atraumatic Neck: no adenopathy, no JVD, supple, symmetrical, trachea midline and thyroid not enlarged, symmetric, no tenderness/mass/nodules Lymph nodes: Cervical, supraclavicular, and axillary nodes normal. Resp: clear to  auscultation bilaterally Back: symmetric, no curvature. ROM normal. No CVA tenderness. Cardio: regular rate and rhythm, S1, S2 normal, no murmur, click, rub or gallop GI: soft, non-tender; bowel sounds normal; no masses,  no organomegaly Extremities: extremities normal, atraumatic, no cyanosis or edema  ECOG PERFORMANCE STATUS: 0 - Asymptomatic  Blood pressure 138/80, pulse 59, temperature 99.1 F (37.3 C), resp. rate 17, height _0  (1.651 m), weight 189 lb (85.73 kg), SpO2 100 %.  LABORATORY DATA: Lab Results  Component Value Date   WBC 2.8* 10/07/2014   HGB 11.2* 10/07/2014   HCT 35.7 10/07/2014   MCV 82.6 10/07/2014   PLT 209 10/07/2014      Chemistry      Component Value Date/Time   NA 139 07/08/2014 1126   K 3.9 07/08/2014 1126   CO2 27 07/08/2014 1126   BUN 12.0 07/08/2014 1126   CREATININE 0.9 07/08/2014 1126      Component Value Date/Time   CALCIUM 9.1 07/08/2014 1126   ALKPHOS 47 07/08/2014 1126   AST 58* 07/08/2014 1126   ALT 25 07/08/2014 1126   BILITOT 0.61 07/08/2014 1126       RADIOGRAPHIC STUDIES: No results found.  ASSESSMENT AND PLAN: This is a very pleasant 48 years old African-American female with persistent leukocytopenia, neutropenia and now mild anemia of unclear etiology. I discussed the lab result with the patient today. I strongly recommended for her to consider a bone marrow biopsy and aspirate to rule out any bone marrow abnormality. The patient agreed to proceed with the bone marrow biopsy. She will be referred to interventional radiology for this procedure. I will see her back  for follow-up visit in 3 weeks for reevaluation and discussion of her biopsy results. The patient was advised to call immediately if she has any concerning symptoms in the interval. The patient voices understanding of current disease status and treatment options and is in agreement with the current care plan.  All questions were answered. The patient knows to call  the clinic with any problems, questions or concerns. We can certainly see the patient much sooner if necessary.  Disclaimer: This note was dictated with voice recognition software. Similar sounding words can inadvertently be transcribed and may not be corrected upon review.

## 2014-10-07 NOTE — Telephone Encounter (Signed)
Pt confirmed labs/ov per 08/10 POF, gave pt avs and calendar... KJ °

## 2014-10-13 ENCOUNTER — Other Ambulatory Visit: Payer: Self-pay | Admitting: Radiology

## 2014-10-14 ENCOUNTER — Ambulatory Visit (HOSPITAL_COMMUNITY)
Admission: RE | Admit: 2014-10-14 | Discharge: 2014-10-14 | Disposition: A | Discharge status: Home / Self Care | Payer: 59 | Source: Ambulatory Visit | Attending: Internal Medicine | Admitting: Internal Medicine

## 2014-10-14 ENCOUNTER — Encounter (HOSPITAL_COMMUNITY): Payer: Self-pay

## 2014-10-14 DIAGNOSIS — D649 Anemia, unspecified: Secondary | ICD-10-CM | POA: Insufficient documentation

## 2014-10-14 DIAGNOSIS — D72819 Decreased white blood cell count, unspecified: Secondary | ICD-10-CM

## 2014-10-14 DIAGNOSIS — D509 Iron deficiency anemia, unspecified: Secondary | ICD-10-CM

## 2014-10-14 LAB — PROTIME-INR
INR: 0.97 (ref 0.00–1.49)
PROTHROMBIN TIME: 13.1 s (ref 11.6–15.2)

## 2014-10-14 LAB — CBC
HCT: 35.3 % — ABNORMAL LOW (ref 36.0–46.0)
Hemoglobin: 11 g/dL — ABNORMAL LOW (ref 12.0–15.0)
MCH: 25.7 pg — AB (ref 26.0–34.0)
MCHC: 31.2 g/dL (ref 30.0–36.0)
MCV: 82.5 fL (ref 78.0–100.0)
Platelets: 237 10*3/uL (ref 150–400)
RBC: 4.28 MIL/uL (ref 3.87–5.11)
RDW: 14.1 % (ref 11.5–15.5)
WBC: 3 10*3/uL — ABNORMAL LOW (ref 4.0–10.5)

## 2014-10-14 LAB — BONE MARROW EXAM

## 2014-10-14 LAB — APTT: aPTT: 30 seconds (ref 24–37)

## 2014-10-14 MED ORDER — FENTANYL CITRATE (PF) 100 MCG/2ML IJ SOLN
INTRAMUSCULAR | Status: AC
  Filled 2014-10-14: qty 2

## 2014-10-14 MED ORDER — HYDROCODONE-ACETAMINOPHEN 5-325 MG PO TABS
1.0000 | ORAL_TABLET | ORAL | Status: DC | PRN
  Filled 2014-10-14: qty 2

## 2014-10-14 MED ORDER — SODIUM CHLORIDE 0.9 % IV SOLN
Freq: Once | INTRAVENOUS | Status: AC
  Administered 2014-10-14: 07:00:00 via INTRAVENOUS

## 2014-10-14 MED ORDER — FENTANYL CITRATE (PF) 100 MCG/2ML IJ SOLN
INTRAMUSCULAR | Status: AC | PRN
  Administered 2014-10-14: 50 ug via INTRAVENOUS
  Administered 2014-10-14: 25 ug via INTRAVENOUS

## 2014-10-14 MED ORDER — MIDAZOLAM HCL 2 MG/2ML IJ SOLN
INTRAMUSCULAR | Status: AC
  Filled 2014-10-14: qty 4

## 2014-10-14 MED ORDER — MIDAZOLAM HCL 2 MG/2ML IJ SOLN
INTRAMUSCULAR | Status: AC | PRN
  Administered 2014-10-14 (×2): 1 mg via INTRAVENOUS

## 2014-10-14 NOTE — Procedures (Signed)
Interventional Radiology Procedure Note  Procedure: CT guided aspirate and core biopsy of right iliac bone Complications: None Recommendations: - Bedrest supine x 2 hrs - Hydrocodone PRN  Pain - Follow biopsy results  Signed,  Damia Bobrowski K. Manoj Enriquez, MD   

## 2014-10-14 NOTE — Discharge Instructions (Signed)
Bone Marrow Aspiration and Bone Biopsy Examination of the bone marrow is a valuable test to diagnose blood disorders. A bone marrow biopsy takes a sample of bone and a small amount of fluid and cells from inside the bone. A bone marrow aspiration removes only the marrow. Bone marrow aspiration and bone biopsies are used to stage different disorders of the blood, such as leukemia. Staging will help your caregiver understand how far the disease has progressed.  The tests are also useful in diagnosing:  Fever of unknown origin (FUO).  Bacterial infections and other widespread fungal infections.  Cancers that have spread (metastasized) to the bone marrow.  Diseases that are characterized by a deficiency of an enzyme (storage diseases). This includes:  Niemann-Pick disease.  Gaucher disease. PROCEDURE  Sites used to get samples include:   Back of your hip bone (posterior iliac crest).  Both aspiration and biopsy.  Front of your hip bone (anterior iliac crest).  Both aspiration and biopsy.  Breastbone (sternum).  Aspiration from your breastbone (done only in adults). This method is rarely used. When you get a hip bone aspiration:  You are placed lying on your side with the upper knee brought up and flexed with the lower leg straight.  The site is prepared, cleaned with an antiseptic scrub, and draped. This keeps the biopsy area clean.  The skin and the area down to the lining of the bone (periosteum) are made numb with a local anesthetic.  The bone marrow aspiration needle is inserted. You will feel pressure on your bone.  Once inside the marrow cavity, a sample of bone marrow is sucked out (aspirated) for pathology slides.  The material collected for bone marrow slides is processed immediately by a technologist.  The technician selects the marrow particles to make the slides for pathology.  The marrow aspiration needle is removed. Then pressure is applied to the site with  gauze until bleeding has stopped. Following an aspiration, a bone marrow biopsy may be performed as well. The technique for this is very similar. A dressing is then applied.  RISKS AND COMPLICATIONS  The main complications of a bone marrow aspiration and biopsy include infection and bleeding.  Complications are uncommon. The procedure may not be performed in patients with bleeding tendencies.  A very rare complication from the procedure is injury to the heart during a breastbone (sternal) marrow aspiration. Only bone marrow aspirations are performed in this area.  Long-lasting pain at the site of the bone marrow aspiration and biopsy is uncommon. Your caregiver will let you know when you are to get your results and will discuss them with you. You may make an appointment with your caregiver to find out the results. Do not assume everything is normal if you have not heard from your caregiver or the medical facility. It is important for you to follow up on all of your test results. Document Released: 02/17/2004 Document Revised: 05/08/2011 Document Reviewed: 02/11/2008 [facility] Patient Information 2015 [place], [place]. This information is not intended to replace advice given to you by your health care provider. Make sure you discuss any questions you have with your health care provider. Bone Marrow Aspiration, Bone Marrow Biopsy Care After Read the instructions outlined below and refer to this sheet in the next few weeks. These discharge instructions provide you with general information on caring for yourself after you leave the hospital. Your caregiver may also give you specific instructions. While your treatment has been planned according to the most  current medical practices available, unavoidable complications occasionally occur. If you have any problems or questions after discharge, call your caregiver. FINDING OUT THE RESULTS OF YOUR TEST Not all test results are available during your visit. If  your test results are not back during the visit, make an appointment with your caregiver to find out the results. Do not assume everything is normal if you have not heard from your caregiver or the medical facility. It is important for you to follow up on all of your test results.  HOME CARE INSTRUCTIONS  You have had sedation and may be sleepy or dizzy. Your thinking may not be as clear as usual. For the next 24 hours:  Only take over-the-counter or prescription medicines for pain, discomfort, and or fever as directed by your caregiver.  Do not drink alcohol.  Do not smoke.  Do not drive.  Do not make important legal decisions.  Do not operate heavy machinery.  Do not care for small children by yourself.  Keep your dressing clean and dry. You may replace dressing with a bandage after 24 hours.  You may take a  shower after 24 hours.  Use an ice pack for 20 minutes every 2 hours while awake for pain as needed. SEEK MEDICAL CARE IF:   There is redness, swelling, or increasing pain at the biopsy site.  There is pus coming from the biopsy site.  There is drainage from a biopsy site lasting longer than one day.  An unexplained oral temperature above 102 F (38.9 C) develops. SEEK IMMEDIATE MEDICAL CARE IF:   You develop a rash.  You have difficulty breathing.  You develop any reaction or side effects to medications given. Document Released: 09/02/2004 Document Revised: 05/08/2011 Document Reviewed: 02/11/2008 [facility] Patient Information 2015 [place], [place]. This information is not intended to replace advice given to you by your health care provider. Make sure you discuss any questions you have with your health care provider.      Conscious Sedation, Adult, Care After Refer to this sheet in the next few weeks. These instructions provide you with information on caring for yourself after your procedure. Your health care provider may also give you more specific instructions.  Your treatment has been planned according to current medical practices, but problems sometimes occur. Call your health care provider if you have any problems or questions after your procedure. WHAT TO EXPECT AFTER THE PROCEDURE  After your procedure:  You may feel sleepy, clumsy, and have poor balance for several hours.  Vomiting may occur if you eat too soon after the procedure. HOME CARE INSTRUCTIONS  Do not participate in any activities where you could become injured for at least 24 hours. Do not:  Drive.  Swim.  Ride a bicycle.  Operate heavy machinery.  Cook.  Use power tools.  Climb ladders.  Work from a high place.  Do not make important decisions or sign legal documents until you are improved.  If you vomit, drink water, juice, or soup when you can drink without vomiting. Make sure you have little or no nausea before eating solid foods.  Only take over-the-counter or prescription medicines for pain, discomfort, or fever as directed by your health care provider.  Make sure you and your family fully understand everything about the medicines given to you, including what side effects may occur.  You should not drink alcohol, take sleeping pills, or take medicines that cause drowsiness for at least 24 hours.  If you smoke,  do not smoke without supervision.  If you are feeling better, you may resume normal activities 24 hours after you were sedated.  Keep all appointments with your health care provider. SEEK MEDICAL CARE IF:  Your skin is pale or bluish in color.  You continue to feel nauseous or vomit.  Your pain is getting worse and is not helped by medicine.  You have bleeding or swelling.  You are still sleepy or feeling clumsy after 24 hours. SEEK IMMEDIATE MEDICAL CARE IF:  You develop a rash.  You have difficulty breathing.  You develop any type of allergic problem.  You have a fever. MAKE SURE YOU:  Understand these instructions.  Will watch  your condition.  Will get help right away if you are not doing well or get worse. Document Released: 12/04/2012 Document Reviewed: 12/04/2012 [facility] Patient Information 2015 [place], [place]. This information is not intended to replace advice given to you by your health care provider. Make sure you discuss any questions you have with your health care provider.

## 2014-10-20 ENCOUNTER — Telehealth: Payer: Self-pay | Admitting: Internal Medicine

## 2014-10-20 NOTE — Telephone Encounter (Signed)
lvm for pt regarding to 8.30 appt moved to 9.3 due to MD out of the office...mailed pt appt sched and lette

## 2014-10-21 ENCOUNTER — Ambulatory Visit
Admission: RE | Admit: 2014-10-21 | Discharge: 2014-10-21 | Disposition: A | Discharge status: Home / Self Care | Payer: 59 | Source: Ambulatory Visit | Attending: Family Medicine | Admitting: Family Medicine

## 2014-10-21 DIAGNOSIS — R748 Abnormal levels of other serum enzymes: Secondary | ICD-10-CM

## 2014-10-22 LAB — CHROMOSOME ANALYSIS, BONE MARROW

## 2014-10-27 ENCOUNTER — Encounter (HOSPITAL_COMMUNITY): Payer: Self-pay

## 2014-10-27 ENCOUNTER — Other Ambulatory Visit: Payer: 59

## 2014-10-27 ENCOUNTER — Ambulatory Visit: Payer: 59 | Admitting: Internal Medicine

## 2014-10-30 ENCOUNTER — Encounter: Payer: Self-pay | Admitting: Internal Medicine

## 2014-10-30 ENCOUNTER — Ambulatory Visit (HOSPITAL_BASED_OUTPATIENT_CLINIC_OR_DEPARTMENT_OTHER): Payer: 59 | Admitting: Internal Medicine

## 2014-10-30 ENCOUNTER — Telehealth: Payer: Self-pay | Admitting: Internal Medicine

## 2014-10-30 ENCOUNTER — Other Ambulatory Visit (HOSPITAL_BASED_OUTPATIENT_CLINIC_OR_DEPARTMENT_OTHER): Payer: 59

## 2014-10-30 VITALS — BP 142/88 | HR 49 | Temp 98.6°F | Resp 18 | Ht 65.0 in | Wt 190.5 lb

## 2014-10-30 DIAGNOSIS — D709 Neutropenia, unspecified: Secondary | ICD-10-CM

## 2014-10-30 DIAGNOSIS — D72819 Decreased white blood cell count, unspecified: Secondary | ICD-10-CM

## 2014-10-30 DIAGNOSIS — D509 Iron deficiency anemia, unspecified: Secondary | ICD-10-CM

## 2014-10-30 LAB — CBC WITH DIFFERENTIAL/PLATELET
BASO%: 0.3 % (ref 0.0–2.0)
BASOS ABS: 0 10*3/uL (ref 0.0–0.1)
EOS ABS: 0.1 10*3/uL (ref 0.0–0.5)
EOS%: 2.3 % (ref 0.0–7.0)
HEMATOCRIT: 33.6 % — AB (ref 34.8–46.6)
HGB: 10.4 g/dL — ABNORMAL LOW (ref 11.6–15.9)
LYMPH#: 1.5 10*3/uL (ref 0.9–3.3)
LYMPH%: 50.8 % — ABNORMAL HIGH (ref 14.0–49.7)
MCH: 25.2 pg (ref 25.1–34.0)
MCHC: 31 g/dL — AB (ref 31.5–36.0)
MCV: 81.4 fL (ref 79.5–101.0)
MONO#: 0.3 10*3/uL (ref 0.1–0.9)
MONO%: 10.4 % (ref 0.0–14.0)
NEUT#: 1.1 10*3/uL — ABNORMAL LOW (ref 1.5–6.5)
NEUT%: 36.2 % — AB (ref 38.4–76.8)
PLATELETS: 223 10*3/uL (ref 145–400)
RBC: 4.13 10*6/uL (ref 3.70–5.45)
RDW: 14.2 % (ref 11.2–14.5)
WBC: 3 10*3/uL — ABNORMAL LOW (ref 3.9–10.3)

## 2014-10-30 NOTE — Progress Notes (Signed)
[place] [phone]   [phone]  OFFICE PROGRESS NOTE  Pcp Not In System No address on file  DIAGNOSIS: Leukocytopenia of unknown etiology  PRIOR THERAPY: None  CURRENT THERAPY: None  INTERVAL HISTORY: Ann Zamora 49 y.o. female returns to the clinic today for follow-up visit. The patient is feeling fine with no specific complaints today. She denied having any significant fever or chills. She has no weight loss or night sweats. She has no bleeding issues. She denied having any chest pain, shortness of breath, cough or hemoptysis. She had bone marrow biopsy and aspirate performed recently for evaluation of her persistent leukocytopenia and the patient is here today for evaluation and discussion of her biopsy results.  MEDICAL HISTORY: Past Medical History  Diagnosis Date  . Anemia   . Hypertension   . Fibroid uterus   . Sleep apnea     No CPAP now. has lost weight and subsided    ALLERGIES:  has No Known Allergies.  MEDICATIONS:  Current Outpatient Prescriptions  Medication Sig Dispense Refill  . amLODipine (NORVASC) 10 MG tablet Take 10 mg by mouth every morning.      No current facility-administered medications for this visit.    SURGICAL HISTORY:  Past Surgical History  Procedure Laterality Date  . Cesarean section      x4, Last Aug 2004    REVIEW OF SYSTEMS:  A comprehensive review of systems was negative.   PHYSICAL EXAMINATION: General appearance: alert, cooperative and no distress Head: Normocephalic, without obvious abnormality, atraumatic Neck: no adenopathy, no JVD, supple, symmetrical, trachea midline and thyroid not enlarged, symmetric, no tenderness/mass/nodules Lymph nodes: Cervical, supraclavicular, and axillary nodes normal. Resp: clear to auscultation bilaterally Back: symmetric, no curvature. ROM normal. No CVA tenderness. Cardio: regular rate and rhythm, S1, S2 normal, no murmur, click, rub or  gallop GI: soft, non-tender; bowel sounds normal; no masses,  no organomegaly Extremities: extremities normal, atraumatic, no cyanosis or edema  ECOG PERFORMANCE STATUS: 0 - Asymptomatic  Last menstrual period 09/28/2014.  LABORATORY DATA: Lab Results  Component Value Date   WBC 3.0* 10/30/2014   HGB 10.4* 10/30/2014   HCT 33.6* 10/30/2014   MCV 81.4 10/30/2014   PLT 223 10/30/2014      Chemistry      Component Value Date/Time   NA 139 07/08/2014 1126   K 3.9 07/08/2014 1126   CO2 27 07/08/2014 1126   BUN 12.0 07/08/2014 1126   CREATININE 0.9 07/08/2014 1126      Component Value Date/Time   CALCIUM 9.1 07/08/2014 1126   ALKPHOS 47 07/08/2014 1126   AST 58* 07/08/2014 1126   ALT 25 07/08/2014 1126   BILITOT 0.61 07/08/2014 1126       RADIOGRAPHIC STUDIES: Ct Biopsy  10/14/2014   CLINICAL DATA:  49 year old female with leukocytopenia  EXAM: CT BIOPSY  Date: 10/14/2014  PROCEDURE: 1. CT guided bone marrow aspiration and core biopsy Interventional Radiologist:  Ann Peaches, MD  ANESTHESIA/SEDATION: Moderate (conscious) sedation was used. 2 mg Versed, 75 mcg Fentanyl were administered intravenously. The patient's vital signs were monitored continuously by radiology nursing throughout the procedure.  Sedation Time: 8 minutes  TECHNIQUE: Informed consent was obtained from the patient following explanation of the procedure, risks, benefits and alternatives. The patient understands, agrees and consents for the procedure. All questions were addressed. A time out was performed.  The patient was positioned prone and noncontrast localization CT was performed of the  pelvis to demonstrate the iliac marrow spaces.  Maximal barrier sterile technique utilized including caps, mask, sterile gowns, sterile gloves, large sterile drape, hand hygiene, and betadine prep.  Under sterile conditions and local anesthesia, an 11 gauge coaxial bone biopsy needle was advanced into the right iliac  marrow space. Needle position was confirmed with CT imaging. Initially, bone marrow aspiration was performed. Next, the 11 gauge outer cannula was utilized to obtain a right iliac bone marrow core biopsy. Needle was removed. Hemostasis was obtained with compression. The patient tolerated the procedure well. Samples were prepared with the cytotechnologist. No immediate complications.  IMPRESSION: CT guided right iliac bone marrow aspiration and core biopsy.  Signed,  Ann Peaches, MD  [facility]  [facility]   Electronically Signed   By: Ann Zamora M.D.   On: 10/14/2014 09:51   US Abdomen Limited Ruq  10/21/2014   CLINICAL DATA:  Elevated LFTs.  EXAM: US ABDOMEN LIMITED - RIGHT UPPER QUADRANT  COMPARISON:  None.  FINDINGS: Gallbladder:  No gallstones or wall thickening visualized. No sonographic Murphy sign noted.  Common bile duct:  Diameter: 2.9 mm  Liver:  Liver is slightly echogenic suggesting fatty infiltration and/or hepatocellular disease.  IMPRESSION: Liver is slightly echogenic suggesting fatty infiltration and/or hepatocellular disease. Exam is otherwise unremarkable.   Electronically Signed   By: Ann Moores  Zamora   On: 10/21/2014 08:35   Patient: Ann Zamora Collected: 10/14/2014 Client: [facility] Accession: [id number] Received: 10/14/2014 Ann Cadet, MD DOB: 1965-08-07 Age: 25 Gender: F Reported: 10/15/2014 [address] Patient Ph: [phone] MRN #: [id number] [place], [place] [address] Visit #: [id number] Chart #: Phone: [phone] Fax: CC: Ann Bears, MD BONE MARROW REPORT FINAL DIAGNOSIS Diagnosis Bone Marrow, Aspirate,Biopsy, and Clot, right iliac - MILDLY HYPOCELLULAR MARROW (30%) WITH BORDERLINE MYELOID HYPOPLASIA. - SEE COMMENT. PERIPHERAL BLOOD: - LEUKOPENIA WITH NEUTROPENIA. - BORDERLINE NORMOCYTIC ANEMIA. Diagnosis Note The core biopsy appears mildly hypocellular and there is a borderline  myeloid hypoplasia. There is no dysplasia of any lineage and no increase in blasts. The overall findings are relatively non-specific. Ann Males MD Pathologist, Electronic Signature (Case signed 10/15/2014)  ASSESSMENT AND PLAN: This is a very pleasant 49 years old African-American female with persistent leukocytopenia, and neutropenia of unclear etiology. The patient bone marrow biopsy and aspirate showed mildly hypercellular marrow but no significant abnormalities concerning for leukemia or lymphoma. I discussed the biopsy results with the patient today. I recommended for her to continue on observation. I will see her back for follow-up visit in 6 months for reevaluation with repeat CBC. The patient was advised to call immediately if she has any concerning symptoms in the interval. The patient voices understanding of current disease status and treatment options and is in agreement with the current care plan.  All questions were answered. The patient knows to call the clinic with any problems, questions or concerns. We can certainly see the patient much sooner if necessary.  Disclaimer: This note was dictated with voice recognition software. Similar sounding words can inadvertently be transcribed and may not be corrected upon review.

## 2014-10-30 NOTE — Telephone Encounter (Signed)
Gave and printed appt sched and avs for pt for March 2017 °

## 2015-03-24 ENCOUNTER — Telehealth: Payer: Self-pay | Admitting: Internal Medicine

## 2015-03-24 NOTE — Telephone Encounter (Signed)
Left message about appointment change from 3/8 to 3/14 due to provider out of the office. Told patient to call the office if there were any questions /concerns about the change. Schedule sent via mail.

## 2015-05-05 ENCOUNTER — Other Ambulatory Visit: Payer: 59

## 2015-05-05 ENCOUNTER — Ambulatory Visit: Payer: 59 | Admitting: Internal Medicine

## 2015-05-11 ENCOUNTER — Ambulatory Visit: Payer: 59 | Admitting: Internal Medicine

## 2015-05-11 ENCOUNTER — Telehealth: Payer: Self-pay | Admitting: Internal Medicine

## 2015-05-11 ENCOUNTER — Other Ambulatory Visit: Payer: 59

## 2015-05-11 NOTE — Telephone Encounter (Signed)
returned call and lvm with new d.t

## 2016-07-14 IMAGING — CT CT BIOPSY
1 series · 10 of 12 positions shown, 16 images · non-contrast
Comparison: none

CLINICAL DATA: 48-year-old female with leukocytopenia
TECHNIQUE: Informed consent was obtained from the patient following explanation
of the procedure, risks, benefits and alternatives. The patient
understands, agrees and consents for the procedure. All questions
were addressed. A time out was performed.

[Series 4: (hospital) 6.0 b30f · axial · 0.78mm/px · z∈[+830,+834]mm · 10 of 12 slices shown, 16 images]
[im 2/12  soft-tissue]
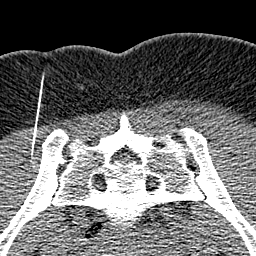
[im 2/12  bone]
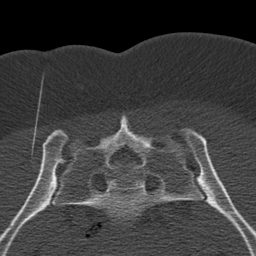
[im 3/12  soft-tissue]
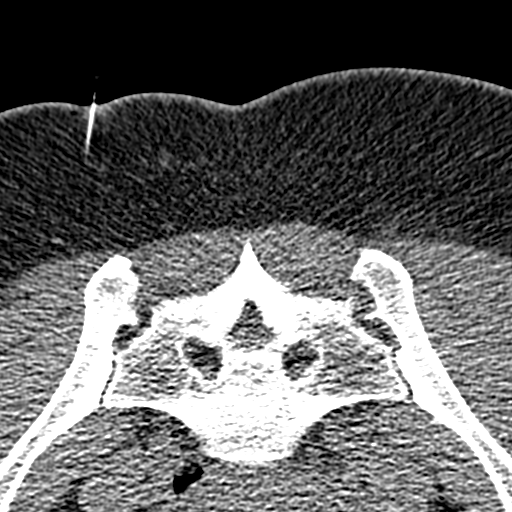
[im 4/12  soft-tissue]
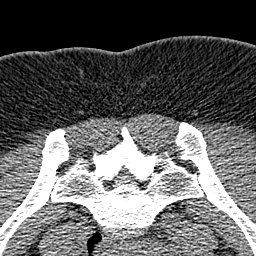
[im 5/12  soft-tissue]
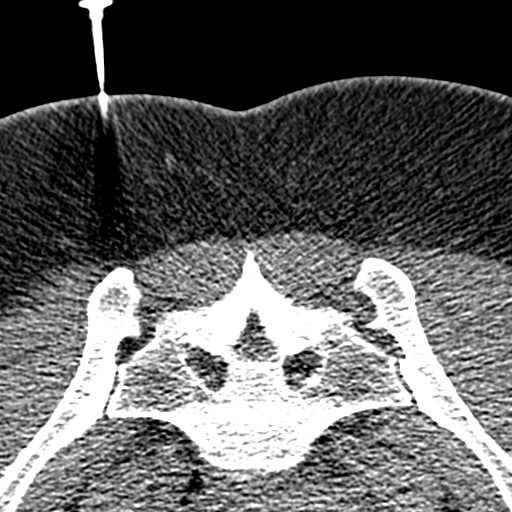
[im 5/12  lung]
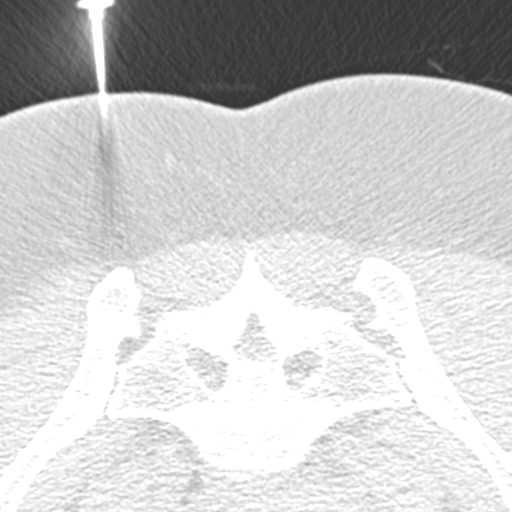
[im 6/12  soft-tissue]
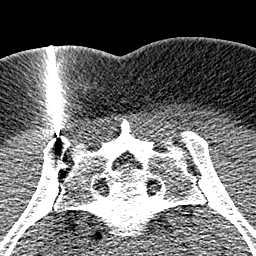
[im 6/12  lung]
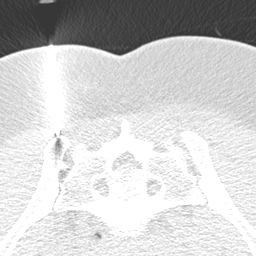
[im 7/12  soft-tissue]
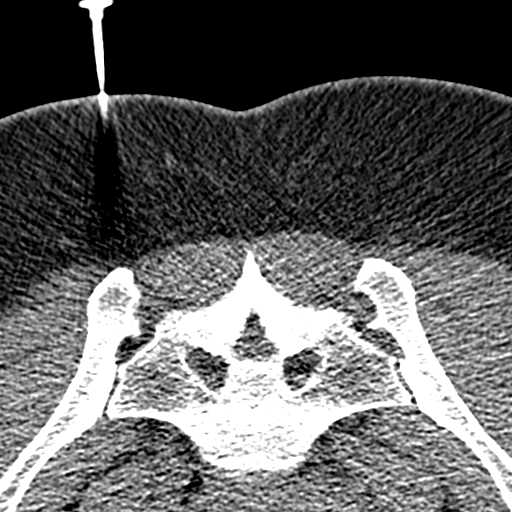
[im 7/12  lung]
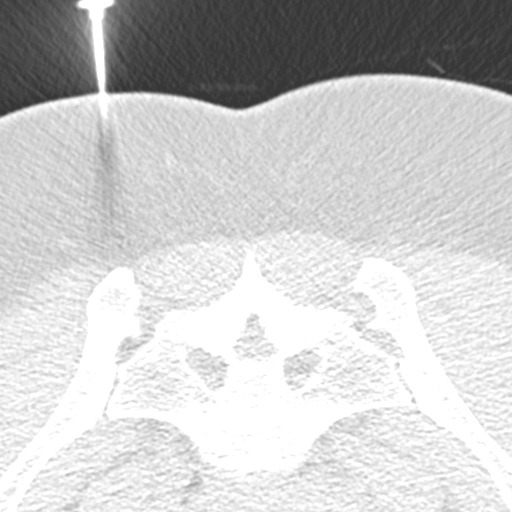
[im 8/12  soft-tissue]
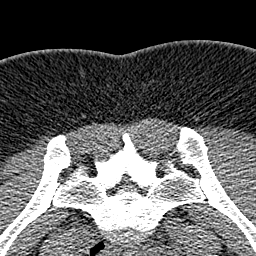
[im 8/12  lung]
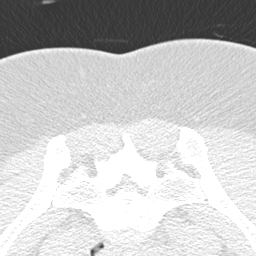
[im 9/12  soft-tissue]
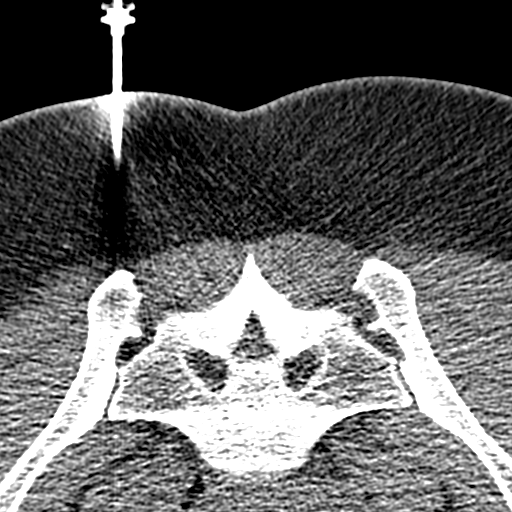
[im 10/12  soft-tissue]
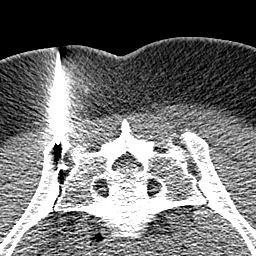
[im 10/12  bone]
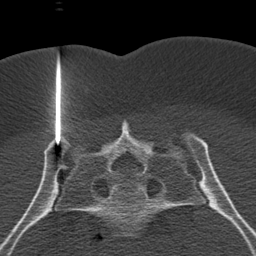
[im 11/12  soft-tissue]
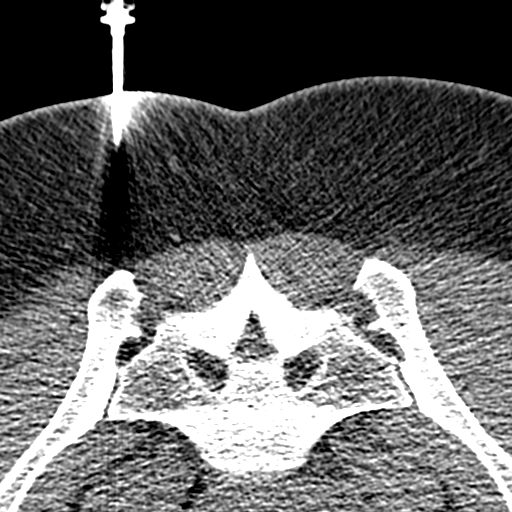

[10 of 12 positions shown; findings below may reference images not displayed]

EXAM:
CT BIOPSY

Date: 10/14/2014

PROCEDURE:
1. CT guided bone marrow aspiration and core biopsy

ANESTHESIA/SEDATION:
Moderate (conscious) sedation was used. 2 mg Versed, 75 mcg Fentanyl
were administered intravenously. The patient's vital signs were
monitored continuously by radiology nursing throughout the
procedure.

Sedation Time: 8 minutes
The patient was positioned prone and noncontrast localization CT was
performed of the pelvis to demonstrate the iliac marrow spaces.

Maximal barrier sterile technique utilized including caps, mask,
sterile gowns, sterile gloves, large sterile drape, hand hygiene,
and betadine prep.

Under sterile conditions and local anesthesia, an 11 gauge coaxial
bone biopsy needle was advanced into the right iliac marrow space.
Needle position was confirmed with CT imaging. Initially, bone
marrow aspiration was performed. Next, the 11 gauge outer cannula
was utilized to obtain a right iliac bone marrow core biopsy. Needle
was removed. Hemostasis was obtained with compression. The patient
tolerated the procedure well. Samples were prepared with the
cytotechnologist. No immediate complications.
IMPRESSION: CT guided right iliac bone marrow aspiration and core biopsy.

## 2016-07-21 IMAGING — US US ABDOMEN LIMITED
1 series · 14 of 25 positions shown · non-contrast
Comparison: None.

CLINICAL DATA: Elevated LFTs.

EXAM:
US ABDOMEN LIMITED - RIGHT UPPER QUADRANT

[Series 1: us abdomen limited · 0.15mm/px · 14 of 45 slices shown]
[im 1/45]
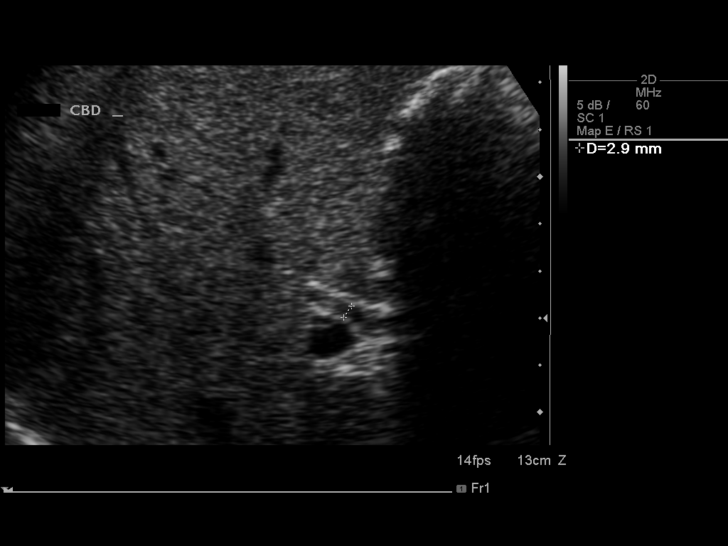
[im 4/45]
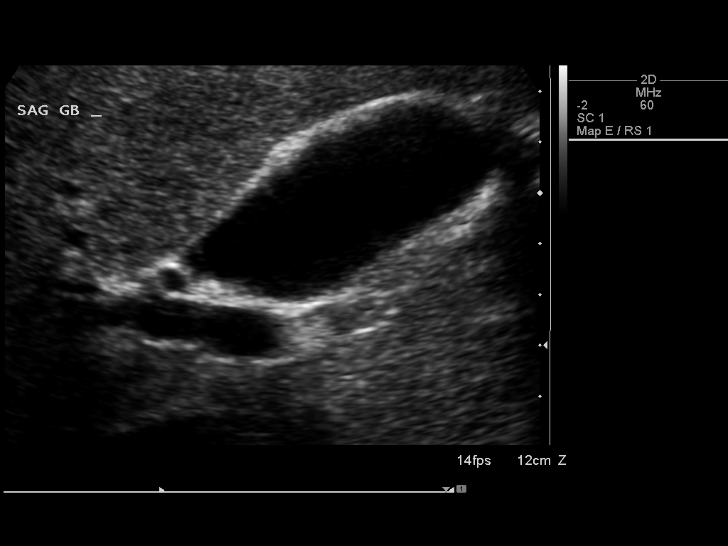
[im 8/45]
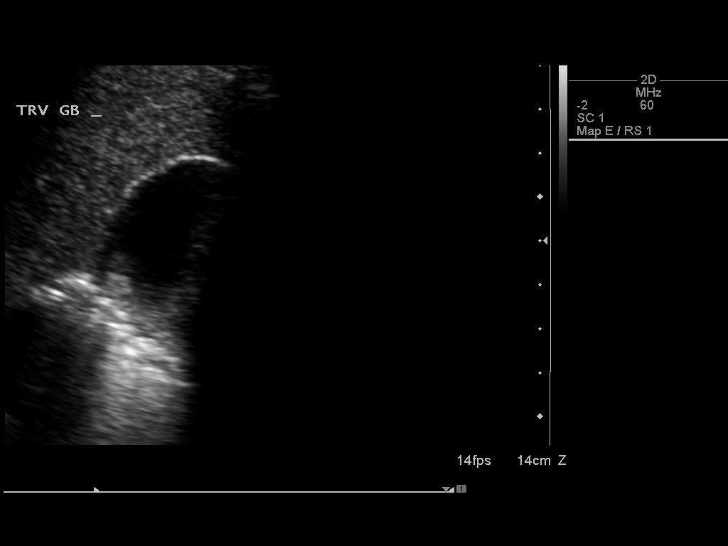
[im 12/45]
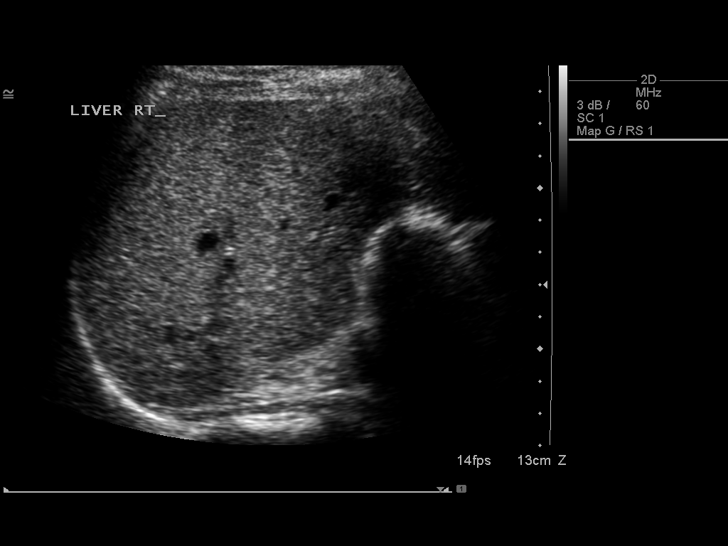
[im 15/45]
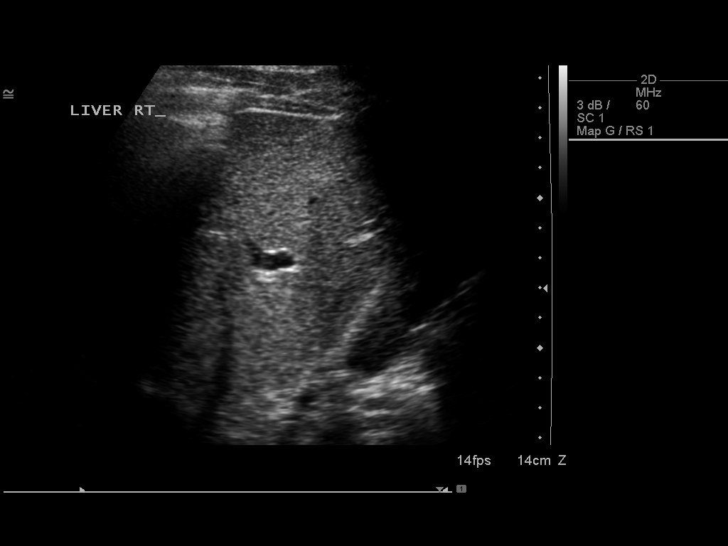
[im 17/45]
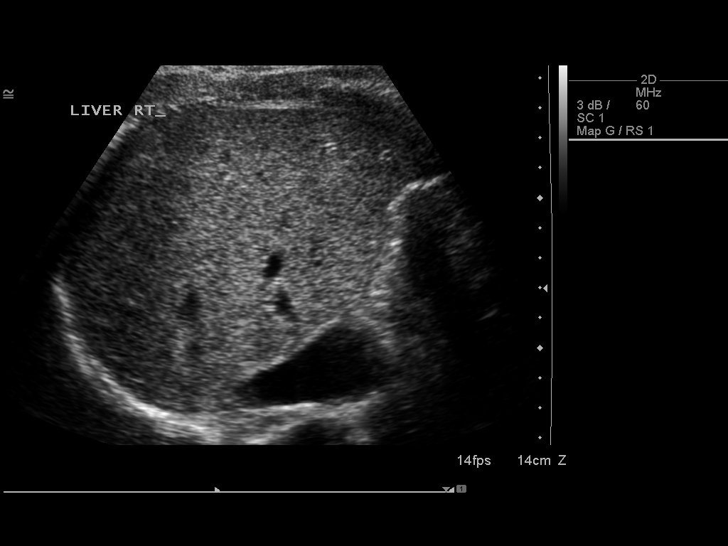
[im 21/45]
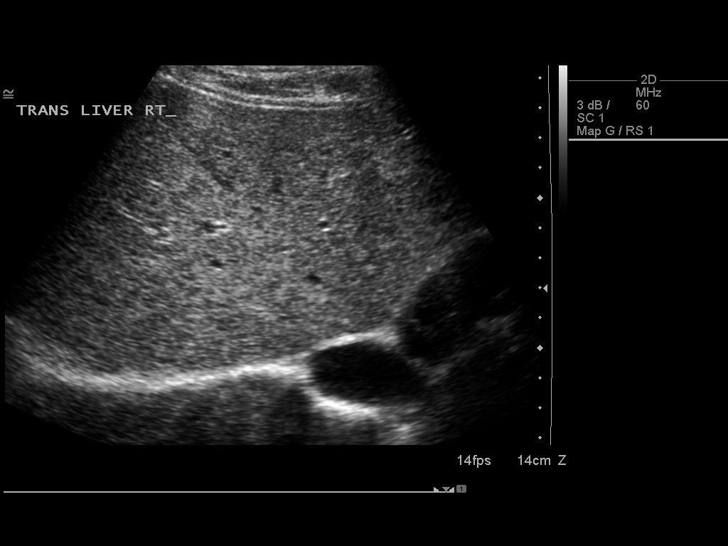
[im 24/45]
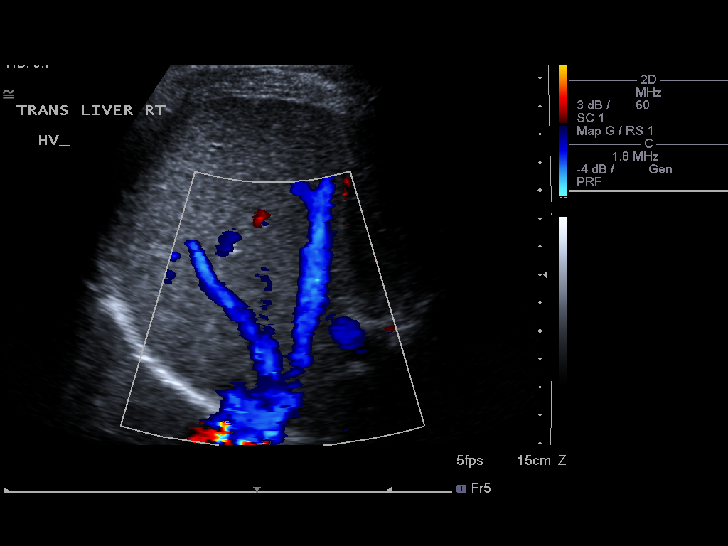
[im 28/45]
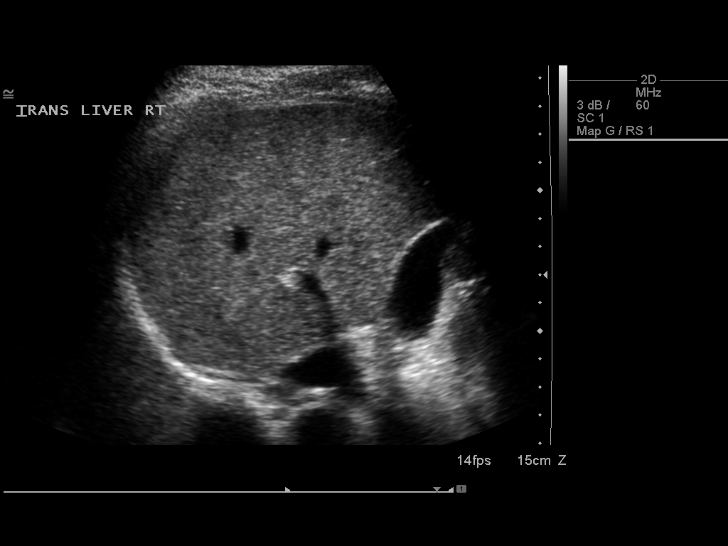
[im 30/45]
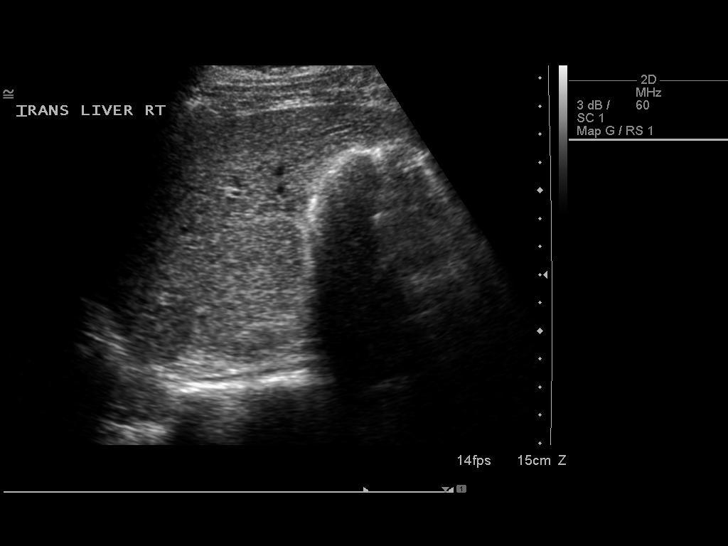
[im 34/45]
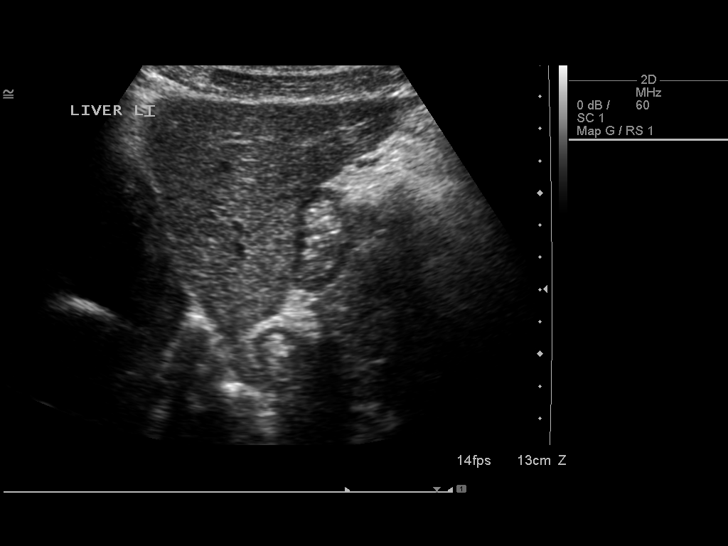
[im 37/45]
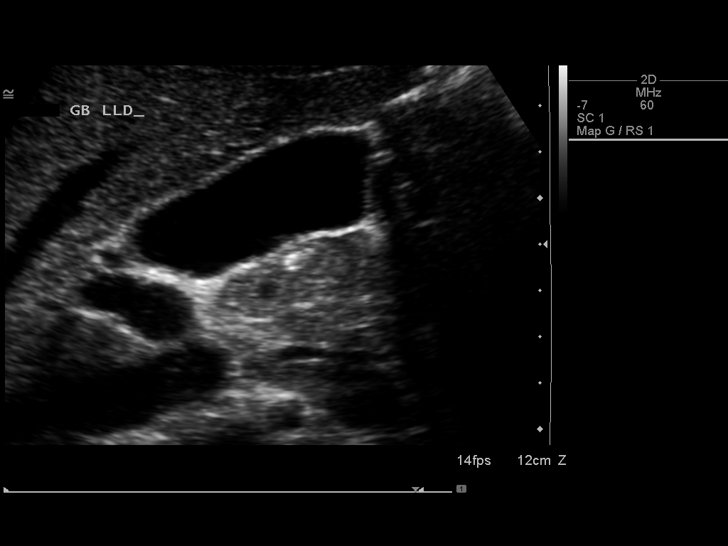
[im 41/45]
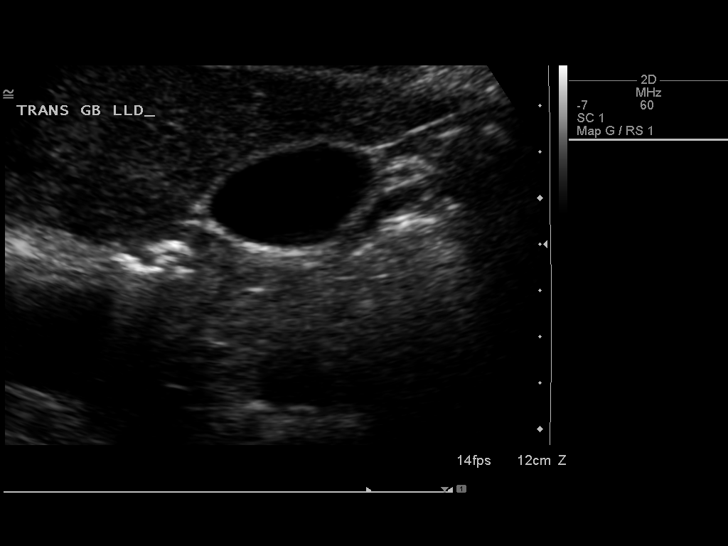
[im 45/45]
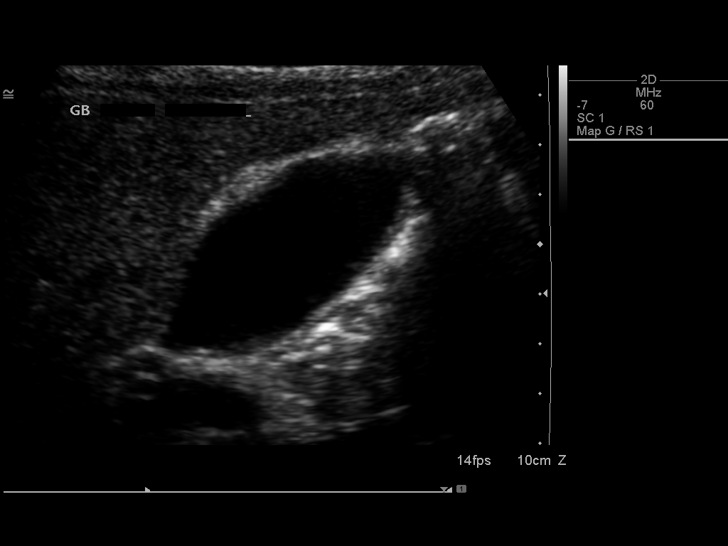

[14 of 25 positions shown; findings below may reference images not displayed]

FINDINGS: Gallbladder:

No gallstones or wall thickening visualized. No sonographic Murphy
sign noted.

Common bile duct:

Diameter: 2.9 mm

Liver:

Liver is slightly echogenic suggesting fatty infiltration and/or
hepatocellular disease.
IMPRESSION: Liver is slightly echogenic suggesting fatty infiltration and/or
hepatocellular disease. Exam is otherwise unremarkable.

## 2016-11-18 DIAGNOSIS — Z1231 Encounter for screening mammogram for malignant neoplasm of breast: Secondary | ICD-10-CM | POA: Diagnosis not present

## 2016-12-23 DIAGNOSIS — Z23 Encounter for immunization: Secondary | ICD-10-CM | POA: Diagnosis not present

## 2017-03-19 DIAGNOSIS — D649 Anemia, unspecified: Secondary | ICD-10-CM | POA: Diagnosis not present

## 2017-03-19 DIAGNOSIS — Z6835 Body mass index (BMI) 35.0-35.9, adult: Secondary | ICD-10-CM | POA: Diagnosis not present

## 2017-03-19 DIAGNOSIS — Z01419 Encounter for gynecological examination (general) (routine) without abnormal findings: Secondary | ICD-10-CM | POA: Diagnosis not present

## 2017-04-30 DIAGNOSIS — I10 Essential (primary) hypertension: Secondary | ICD-10-CM | POA: Diagnosis not present

## 2017-04-30 DIAGNOSIS — E785 Hyperlipidemia, unspecified: Secondary | ICD-10-CM | POA: Diagnosis not present

## 2017-04-30 DIAGNOSIS — D649 Anemia, unspecified: Secondary | ICD-10-CM | POA: Diagnosis not present

## 2017-05-07 DIAGNOSIS — E785 Hyperlipidemia, unspecified: Secondary | ICD-10-CM | POA: Diagnosis not present

## 2017-05-07 DIAGNOSIS — D649 Anemia, unspecified: Secondary | ICD-10-CM | POA: Diagnosis not present

## 2017-05-07 DIAGNOSIS — I10 Essential (primary) hypertension: Secondary | ICD-10-CM | POA: Diagnosis not present

## 2017-05-09 ENCOUNTER — Telehealth: Payer: Self-pay | Admitting: Internal Medicine

## 2017-05-09 NOTE — Telephone Encounter (Signed)
Faxed labs and path reports to [facility] physicians

## 2017-05-21 DIAGNOSIS — D509 Iron deficiency anemia, unspecified: Secondary | ICD-10-CM | POA: Diagnosis not present

## 2017-05-26 ENCOUNTER — Ambulatory Visit (HOSPITAL_COMMUNITY)
Admission: EM | Admit: 2017-05-26 | Discharge: 2017-05-26 | Disposition: A | Discharge status: Home / Self Care | Payer: 59 | Attending: Family Medicine | Admitting: Family Medicine

## 2017-05-26 ENCOUNTER — Other Ambulatory Visit: Payer: Self-pay

## 2017-05-26 ENCOUNTER — Encounter (HOSPITAL_COMMUNITY): Payer: Self-pay | Admitting: *Deleted

## 2017-05-26 DIAGNOSIS — K219 Gastro-esophageal reflux disease without esophagitis: Secondary | ICD-10-CM | POA: Diagnosis not present

## 2017-05-26 DIAGNOSIS — R0789 Other chest pain: Secondary | ICD-10-CM | POA: Diagnosis not present

## 2017-05-26 DIAGNOSIS — I10 Essential (primary) hypertension: Secondary | ICD-10-CM | POA: Diagnosis not present

## 2017-05-26 NOTE — ED Triage Notes (Signed)
Reports starting with "sharp, split second" right upper chest pains 2 days ago that were transient.  Since then, has been occurring more frequently and stronger.  Also c/o "coolness" in LUE.  Denies any SOB, nausea. Denies any pains at present.

## 2017-05-26 NOTE — Discharge Instructions (Signed)
Please return here or to the Emergency Department immediately should you feel worse in any way or have any of the following symptoms: increasing or different chest pain, pain that spreads to your arm, neck, jaw, back or abdomen, shortness of breath, or nausea and vomiting.  You may start taking Zantac 150mg  twice daily to see if this improves your heartburn symptoms.

## 2017-06-04 NOTE — ED Provider Notes (Signed)
[place]   [id number] 05/26/17 Arrival Time: 4098  ASSESSMENT & PLAN:  1. Gastroesophageal reflux disease without esophagitis   2. Atypical chest pain   3. Essential hypertension    Trial of OTC Zantac. Noticed increased BP. Has not taken meds today. Will take tonight. Watch closely. Will schedule f/u with PCP asap. Chest pain precautions given. Reviewed expectations re: course of current medical issues. Questions answered. Outlined signs and symptoms indicating need for more acute intervention. Patient verbalized understanding. After Visit Summary given.   SUBJECTIVE:  Ann Zamora is a 52 y.o. female who presents with complaint of on/off chest discomfort described as "sharp and burning". Noticed about 2 days ago. Infrequent lasting minutes then slowly resolving. Mostly mid to R chest. Today felt more. + GERD symptoms and questions relation. Questions worse after eating. No associated SOB/n/v. No OTC medications. No LE edema or pain.  ROS: As per HPI.   OBJECTIVE:  Vitals:   05/26/17 1910 05/26/17 2002  BP: (!) 222/84 (!) 176/103  Pulse: (!) 52   Resp: 20   Temp: 98.6 F (37 C)   TempSrc: Oral   SpO2: 100%     General appearance: alert; no distress Eyes: PERRLA; EOMI; conjunctiva normal HENT: normocephalic; atraumatic Neck: supple Lungs: clear to auscultation bilaterally Heart: regular rate and rhythm Chest Wall: non-tender Abdomen: soft, non-tender; bowel sounds normal; no masses or organomegaly; no guarding or rebound tenderness Extremities: no cyanosis or edema; symmetrical with no gross deformities Skin: warm and dry Psychological: alert and cooperative; normal mood and affect  ECG: Orders placed or performed during the hospital encounter of 05/26/17  . ED EKG  . ED EKG    No Known Allergies  Past Medical History:  Diagnosis Date  . Anemia   . Fibroid uterus   . Hypertension   . Sleep apnea    No CPAP now. has lost weight and  subsided   Social History   Socioeconomic History  . Marital status: Married    Spouse name: Not on file  . Number of children: Not on file  . Years of education: Not on file  . Highest education level: Not on file  Occupational History  . Not on file  Social Needs  . Financial resource strain: Not on file  . Food insecurity:    Worry: Not on file    Inability: Not on file  . Transportation needs:    Medical: Not on file    Non-medical: Not on file  Tobacco Use  . Smoking status: Never Smoker  . Smokeless tobacco: Never Used  Substance and Sexual Activity  . Alcohol use: Yes    Comment: rarely  . Drug use: No  . Sexual activity: Not on file  Lifestyle  . Physical activity:    Days per week: Not on file    Minutes per session: Not on file  . Stress: Not on file  Relationships  . Social connections:    Talks on phone: Not on file    Gets together: Not on file    Attends religious service: Not on file    Active member of club or organization: Not on file    Attends meetings of clubs or organizations: Not on file    Relationship status: Not on file  . Intimate partner violence:    Fear of current or ex partner: Not on file    Emotionally abused: Not on file    Physically abused: Not on file  Forced sexual activity: Not on file  Other Topics Concern  . Not on file  Social History Narrative  . Not on file   Family History  Problem Relation Age of Onset  . Diabetes Mother   . Hypertension Mother   . CAD Mother   . Diabetes Father   . Hypertension Father    Past Surgical History:  Procedure Laterality Date  . CESAREAN SECTION     x4, Last Aug 2004     Vanessa Kick, MD 06/04/17 [phone]

## 2017-07-09 DIAGNOSIS — D72819 Decreased white blood cell count, unspecified: Secondary | ICD-10-CM | POA: Diagnosis not present

## 2017-11-12 DIAGNOSIS — I10 Essential (primary) hypertension: Secondary | ICD-10-CM | POA: Diagnosis not present

## 2017-11-12 DIAGNOSIS — D72819 Decreased white blood cell count, unspecified: Secondary | ICD-10-CM | POA: Diagnosis not present

## 2017-11-12 DIAGNOSIS — Z23 Encounter for immunization: Secondary | ICD-10-CM | POA: Diagnosis not present

## 2017-11-12 DIAGNOSIS — E7800 Pure hypercholesterolemia, unspecified: Secondary | ICD-10-CM | POA: Diagnosis not present

## 2017-12-17 DIAGNOSIS — M2042 Other hammer toe(s) (acquired), left foot: Secondary | ICD-10-CM | POA: Diagnosis not present

## 2017-12-17 DIAGNOSIS — M79672 Pain in left foot: Secondary | ICD-10-CM | POA: Diagnosis not present

## 2017-12-17 DIAGNOSIS — G5762 Lesion of plantar nerve, left lower limb: Secondary | ICD-10-CM | POA: Diagnosis not present

## 2017-12-31 DIAGNOSIS — M2042 Other hammer toe(s) (acquired), left foot: Secondary | ICD-10-CM | POA: Diagnosis not present

## 2017-12-31 DIAGNOSIS — G5762 Lesion of plantar nerve, left lower limb: Secondary | ICD-10-CM | POA: Diagnosis not present

## 2017-12-31 DIAGNOSIS — M79672 Pain in left foot: Secondary | ICD-10-CM | POA: Diagnosis not present

## 2018-01-17 DIAGNOSIS — M79672 Pain in left foot: Secondary | ICD-10-CM | POA: Diagnosis not present

## 2018-01-17 DIAGNOSIS — M2042 Other hammer toe(s) (acquired), left foot: Secondary | ICD-10-CM | POA: Diagnosis not present

## 2018-01-17 DIAGNOSIS — G5762 Lesion of plantar nerve, left lower limb: Secondary | ICD-10-CM | POA: Diagnosis not present

## 2018-01-19 DIAGNOSIS — Z1231 Encounter for screening mammogram for malignant neoplasm of breast: Secondary | ICD-10-CM | POA: Diagnosis not present

## 2018-04-01 DIAGNOSIS — R350 Frequency of micturition: Secondary | ICD-10-CM | POA: Diagnosis not present

## 2018-04-01 DIAGNOSIS — Z6837 Body mass index (BMI) 37.0-37.9, adult: Secondary | ICD-10-CM | POA: Diagnosis not present

## 2018-04-01 DIAGNOSIS — Z01419 Encounter for gynecological examination (general) (routine) without abnormal findings: Secondary | ICD-10-CM | POA: Diagnosis not present

## 2018-04-29 DIAGNOSIS — I10 Essential (primary) hypertension: Secondary | ICD-10-CM | POA: Diagnosis not present

## 2018-04-29 DIAGNOSIS — Z0000 Encounter for general adult medical examination without abnormal findings: Secondary | ICD-10-CM | POA: Diagnosis not present

## 2018-04-29 DIAGNOSIS — E785 Hyperlipidemia, unspecified: Secondary | ICD-10-CM | POA: Diagnosis not present

## 2018-04-29 DIAGNOSIS — D72819 Decreased white blood cell count, unspecified: Secondary | ICD-10-CM | POA: Diagnosis not present

## 2018-04-29 DIAGNOSIS — E559 Vitamin D deficiency, unspecified: Secondary | ICD-10-CM | POA: Diagnosis not present

## 2020-05-25 LAB — COLOGUARD: COLOGUARD: NEGATIVE

## 2021-07-29 ENCOUNTER — Telehealth: Payer: Self-pay

## 2021-07-29 NOTE — Telephone Encounter (Signed)
NOTES SCANNED TO REFERRAL 

## 2021-08-09 NOTE — Progress Notes (Incomplete)
CARDIOLOGY CONSULT NOTE       Patient ID: Ann Zamora MRN: [id number] DOB/AGE: 1965/12/29 56 y.o.  Admit date: (Not on file) Referring Physician: Charlett Lango PA Primary Physician: Pcp, No Primary Cardiologist: New Reason for Consultation: HTN, Abnormal ECG   Active Problems:   * No active hospital problems. *   HPI:  56 y.o. referred by Charlett Lango for abnormal ECG in setting of HTN, and HLD. She is on statin and takes norvasc and HCTZ for BP.  She is overweight and has been on SAxenda and changed to [facility]   Some atypical right sided fleeting sharp chest pains only lasts seconds Positional with quick upper body motions ECG shows SR with LVH and strain more apparent T wave changes compared to ECG done in 2019   ROS All other systems reviewed and negative except as noted above  Past Medical History:  Diagnosis Date  . Anemia   . Fibroid uterus   . Hypertension   . Sleep apnea    No CPAP now. has lost weight and subsided    Family History  Problem Relation Age of Onset  . Diabetes Mother   . Hypertension Mother   . CAD Mother   . Diabetes Father   . Hypertension Father     Social History   Socioeconomic History  . Marital status: Married    Spouse name: Not on file  . Number of children: Not on file  . Years of education: Not on file  . Highest education level: Not on file  Occupational History  . Not on file  Tobacco Use  . Smoking status: Never  . Smokeless tobacco: Never  Vaping Use  . Vaping Use: Never used  Substance and Sexual Activity  . Alcohol use: Yes    Comment: rarely  . Drug use: No  . Sexual activity: Not on file  Other Topics Concern  . Not on file  Social History Narrative  . Not on file   Social Determinants of Health   Financial Resource Strain: Not on file  Food Insecurity: Not on file  Transportation Needs: Not on file  Physical Activity: Not on file  Stress: Not on file  Social Connections: Not on file  Intimate Partner  Violence: Not on file    Past Surgical History:  Procedure Laterality Date  . CESAREAN SECTION     x4, Last Aug 2004      Current Outpatient Medications:  .  amLODipine (NORVASC) 10 MG tablet, Take 10 mg by mouth every morning. , Disp: , Rfl:  .  Fe Cbn-Fe Gluc-FA-B12-C-DSS (FERRALET 90 PO), Take by mouth., Disp: , Rfl:  .  OXYBUTYNIN CHLORIDE PO, Take by mouth., Disp: , Rfl:     Physical Exam:  There were no vitals taken for this visit.   Affect appropriate Healthy:  appears stated age 65: normal Neck supple with no adenopathy JVP normal no bruits no thyromegaly Lungs clear with no wheezing and good diaphragmatic motion Heart:  S1/S2 no murmur, no rub, gallop or click PMI normal Abdomen: benighn, BS positve, no tenderness, no AAA no bruit.  No HSM or HJR Distal pulses intact with no bruits No edema Neuro non-focal Skin warm and dry No muscular weakness   Labs:   Lab Results  Component Value Date   WBC 3.0 (L) 10/30/2014   HGB 10.4 (L) 10/30/2014   HCT 33.6 (L) 10/30/2014   MCV 81.4 10/30/2014   PLT 223 10/30/2014   No results  for input(s): "NA", "K", "CL", "CO2", "BUN", "CREATININE", "CALCIUM", "PROT", "BILITOT", "ALKPHOS", "ALT", "AST", "GLUCOSE" in the last 168 hours.  Invalid input(s): "LABALBU" No results found for: "CKTOTAL", "CKMB", "CKMBINDEX", "TROPONINI" No results found for: "CHOL" No results found for: "HDL" No results found for: "LDLCALC" No results found for: "TRIG" No results found for: "CHOLHDL" No results found for: "LDLDIRECT"    Radiology: No results found.  EKG: SR LVH  ***   ASSESSMENT AND PLAN:   Abnormal ECG:  likely LVH with strain TTE to r/o severe LVH or HOCM  Chest Pain: atypical with abnormal ECG and CRFls HLT/HTN Shared decision making favor cardiac CTA to r/o epicardial CAD HTN:  continue current medication f/u with primary HLD:  on statin labs with primary Tightness of control pending calcium score Obesity:   discussed healthy diet/exercise Prescribed Wegovy by primary   Cardiac CTA Lopressor 50 mg 2 hours before TTE r/o HOCM  F/U PRN pending results   Signed: Jenkins Rouge 08/09/2021, 10:11 AM

## 2021-08-22 ENCOUNTER — Ambulatory Visit (INDEPENDENT_AMBULATORY_CARE_PROVIDER_SITE_OTHER): Payer: 59 | Admitting: Cardiovascular Disease

## 2021-08-22 ENCOUNTER — Encounter: Payer: Self-pay | Admitting: Cardiovascular Disease

## 2021-08-22 VITALS — BP 106/62 | HR 54 | Ht 65.0 in | Wt 225.0 lb

## 2021-08-22 DIAGNOSIS — R079 Chest pain, unspecified: Secondary | ICD-10-CM | POA: Diagnosis not present

## 2021-08-22 DIAGNOSIS — Z01812 Encounter for preprocedural laboratory examination: Secondary | ICD-10-CM | POA: Diagnosis not present

## 2021-08-22 DIAGNOSIS — R9431 Abnormal electrocardiogram [ECG] [EKG]: Secondary | ICD-10-CM | POA: Diagnosis not present

## 2021-08-22 DIAGNOSIS — E782 Mixed hyperlipidemia: Secondary | ICD-10-CM

## 2021-08-22 DIAGNOSIS — I10 Essential (primary) hypertension: Secondary | ICD-10-CM | POA: Diagnosis not present

## 2021-08-22 MED ORDER — METOPROLOL TARTRATE 25 MG PO TABS: ORAL_TABLET | ORAL | 0 refills | Status: AC

## 2021-09-05 ENCOUNTER — Ambulatory Visit (HOSPITAL_COMMUNITY): Payer: 59 | Attending: Cardiology

## 2021-09-05 DIAGNOSIS — R9431 Abnormal electrocardiogram [ECG] [EKG]: Secondary | ICD-10-CM | POA: Insufficient documentation

## 2021-09-05 LAB — ECHOCARDIOGRAM COMPLETE
Area-P 1/2: 2.73 cm2
S' Lateral: 2.9 cm

## 2021-09-12 ENCOUNTER — Other Ambulatory Visit (HOSPITAL_COMMUNITY): Payer: 59

## 2021-09-13 ENCOUNTER — Telehealth: Payer: Self-pay | Admitting: Cardiovascular Disease

## 2021-09-13 NOTE — Telephone Encounter (Signed)
Patient is returning RN's call regarding Echo results.

## 2021-09-13 NOTE — Telephone Encounter (Signed)
Per Dr. Johnsie Cancel, she does have thick heart muscle but not causing issue now  Will see what her cardiac CT shows may need MRI of heart in future.  Called patient with results. Patient has upcoming CT. Patient needs BMET. Patient stated she had lab work done at PCP office. Called over to [place] to have lab work fax to our office.   Lab work faxed to our office and received. Will have Medical Records get lab work scanned into Lab section of chart. Will also put under media, so CT department can see CMET results. BUN and creatinine in normal range.

## 2021-09-16 ENCOUNTER — Telehealth (HOSPITAL_COMMUNITY): Payer: Self-pay | Admitting: *Deleted

## 2021-09-16 NOTE — Telephone Encounter (Signed)
Reaching out to patient to offer assistance regarding upcoming cardiac imaging study; pt verbalizes understanding of appt date/time, parking situation and where to check in, pre-test NPO status and medications ordered, and verified current allergies; name and call back number provided for further questions should they arise  Ann Clement RN Navigator Cardiac [place] [facility] [phone] office [phone] cell  Patient aware to arrive at 8:30am.

## 2021-09-19 ENCOUNTER — Ambulatory Visit (HOSPITAL_COMMUNITY)
Admission: RE | Admit: 2021-09-19 | Discharge: 2021-09-19 | Disposition: A | Discharge status: Home / Self Care | Payer: No Typology Code available for payment source | Source: Ambulatory Visit | Attending: Cardiovascular Disease | Admitting: Cardiovascular Disease

## 2021-09-19 DIAGNOSIS — R9431 Abnormal electrocardiogram [ECG] [EKG]: Secondary | ICD-10-CM | POA: Diagnosis present

## 2021-09-19 MED ORDER — NITROGLYCERIN 0.4 MG SL SUBL
0.8000 mg | SUBLINGUAL_TABLET | Freq: Once | SUBLINGUAL | Status: AC
  Administered 2021-09-19: 0.8 mg via SUBLINGUAL

## 2021-09-19 MED ORDER — NITROGLYCERIN 0.4 MG SL SUBL
SUBLINGUAL_TABLET | SUBLINGUAL | Status: AC
  Filled 2021-09-19: qty 2

## 2021-09-19 MED ORDER — IOHEXOL 350 MG/ML SOLN
100.0000 mL | Freq: Once | INTRAVENOUS | Status: AC | PRN
  Administered 2021-09-19: 100 mL via INTRAVENOUS

## 2021-09-20 ENCOUNTER — Telehealth: Payer: Self-pay | Admitting: Cardiovascular Disease

## 2021-09-20 NOTE — Telephone Encounter (Signed)
Patient is returning call to discuss CT results. 

## 2021-09-20 NOTE — Telephone Encounter (Signed)
Called patient back with her CT calcium score results.  Per Dr. Johnsie Cancel, low calcium score and no obstructive CAD, great. Patient asked about her echo results, and wanted to know if she needed to follow up with Dr. Johnsie Cancel in the next year or would she just see him as needed.    Echo Results " She does have thick heart muscle but not causing issue now Will see what her cardiac CT shows may need MRI of heart in future".    Will forward to Dr. Johnsie Cancel for advisement.

## 2021-09-21 NOTE — Telephone Encounter (Signed)
Left detailed message to patient, that Dr. Johnsie Cancel will see her in a year.

## 2022-10-24 ENCOUNTER — Other Ambulatory Visit (HOSPITAL_COMMUNITY): Payer: Self-pay

## 2022-10-25 ENCOUNTER — Other Ambulatory Visit (HOSPITAL_COMMUNITY): Payer: Self-pay

## 2022-10-25 MED ORDER — SEMAGLUTIDE-WEIGHT MANAGEMENT 0.25 MG/0.5ML ~~LOC~~ SOAJ
0.2500 mg | SUBCUTANEOUS | 0 refills | Status: AC
  Filled 2022-10-25 (×4): qty 2, 28d supply, fill #0

## 2022-10-26 ENCOUNTER — Other Ambulatory Visit (HOSPITAL_COMMUNITY): Payer: Self-pay

## 2022-11-03 ENCOUNTER — Other Ambulatory Visit (HOSPITAL_COMMUNITY): Payer: Self-pay
